# Patient Record
Sex: Female | Born: 1995 | Race: White | Hispanic: No | Marital: Married | Smoking: Never smoker
Health system: Southern US, Community
[De-identification: ages and names within clinical notes are randomized; demographics above are authoritative.]

## PROBLEM LIST (undated history)

## (undated) DIAGNOSIS — Z789 Other specified health status: Secondary | ICD-10-CM

## (undated) DIAGNOSIS — S92909A Unspecified fracture of unspecified foot, initial encounter for closed fracture: Secondary | ICD-10-CM

## (undated) HISTORY — DX: Other specified health status: Z78.9

## (undated) HISTORY — PX: NO PAST SURGERIES: SHX2092

---
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
## 2018-07-27 ENCOUNTER — Other Ambulatory Visit: Payer: Self-pay

## 2018-07-27 ENCOUNTER — Emergency Department (HOSPITAL_COMMUNITY)
Admission: EM | Admit: 2018-07-27 | Discharge: 2018-07-27 | Disposition: A | Discharge status: Home / Self Care | Payer: Managed Care, Other (non HMO) | Attending: Emergency Medicine | Admitting: Emergency Medicine

## 2018-07-27 ENCOUNTER — Encounter (HOSPITAL_COMMUNITY): Payer: Self-pay | Admitting: Emergency Medicine

## 2018-07-27 ENCOUNTER — Emergency Department (HOSPITAL_COMMUNITY): Payer: Managed Care, Other (non HMO)

## 2018-07-27 DIAGNOSIS — W1842XA Slipping, tripping and stumbling without falling due to stepping into hole or opening, initial encounter: Secondary | ICD-10-CM | POA: Diagnosis not present

## 2018-07-27 DIAGNOSIS — Y990 Civilian activity done for income or pay: Secondary | ICD-10-CM | POA: Diagnosis not present

## 2018-07-27 DIAGNOSIS — Y929 Unspecified place or not applicable: Secondary | ICD-10-CM | POA: Diagnosis not present

## 2018-07-27 DIAGNOSIS — S93402A Sprain of unspecified ligament of left ankle, initial encounter: Secondary | ICD-10-CM | POA: Diagnosis not present

## 2018-07-27 DIAGNOSIS — Y939 Activity, unspecified: Secondary | ICD-10-CM | POA: Diagnosis not present

## 2018-07-27 DIAGNOSIS — S99912A Unspecified injury of left ankle, initial encounter: Secondary | ICD-10-CM | POA: Diagnosis present

## 2018-07-27 HISTORY — DX: Unspecified fracture of unspecified foot, initial encounter for closed fracture: S92.909A

## 2018-07-27 MED ORDER — IBUPROFEN 800 MG PO TABS
800.0000 mg | ORAL_TABLET | Freq: Once | ORAL | Status: AC
  Administered 2018-07-27: 800 mg via ORAL
  Filled 2018-07-27: qty 1

## 2018-07-27 MED ORDER — NAPROXEN 500 MG PO TABS: 500.0000 mg | ORAL_TABLET | Freq: Two times a day (BID) | ORAL | 0 refills | Status: DC

## 2018-07-27 NOTE — ED Triage Notes (Addendum)
Pt reports left ankle pain when stepped in a hole. Pt reports left ankle pain and swelling immediately after injury. Pt reports tingling sensation at this time. Pt reports able to bear weight but reports increased pain at this time.

## 2018-07-27 NOTE — Discharge Instructions (Signed)
X-rays are normal, there is no broken bones, ibuprofen every 8 hours as needed, elevate ice and rest, seek medical exam in 1 week if still having pain or swelling

## 2018-07-27 NOTE — ED Provider Notes (Signed)
[facility] Provider Note   CSN: [id number] Arrival date & time: 07/27/18  [id number]     History   Chief Complaint Chief Complaint  Patient presents with  . Ankle Pain    HPI Melinda Wright is a 22 y.o. female.  The history is provided by the patient.  Ankle Pain   The incident occurred 12 to 24 hours ago. The incident occurred at work. The injury mechanism was a fall. The pain is present in the left ankle. The quality of the pain is described as throbbing. The pain is moderate. The pain has been constant since onset. Pertinent negatives include no numbness, no loss of motion, no muscle weakness, no loss of sensation and no tingling. She reports no foreign bodies present. The symptoms are aggravated by bearing weight. She has tried nothing for the symptoms.    22 y/o female - was working yesterday when she stepped in a hole and rolled her ankle - she had acute onset of pain and mild swelling - but has been ambulatory on the ankle since - the pain is persistent and when she awoke this AM it was much more intense.  She has mild swelling, no other injuries / knee / hip / or back / neck pain.  No meds prior to arrival.    Past Medical History:  Diagnosis Date  . Foot fracture 2012   left metatarsal fracture    There are no active problems to display for this patient.   History reviewed. No pertinent surgical history.   OB History   None      Home Medications    Prior to Admission medications   Medication Sig Start Date End Date Taking? Authorizing Provider  naproxen (NAPROSYN) 500 MG tablet Take 1 tablet (500 mg total) by mouth 2 (two) times daily with a meal. 07/27/18   Eber HongMiller, Raghad Lorenz, MD    Family History History reviewed. No pertinent family history.  Social History Social History   Tobacco Use  . Smoking status: Never Smoker  . Smokeless tobacco: Never Used  Substance Use Topics  . Alcohol use: Never    Frequency: Never  . Drug use: Never      Allergies   Patient has no allergy information on record.   Review of Systems Review of Systems  Musculoskeletal: Positive for joint swelling. Negative for back pain.  Neurological: Negative for tingling, weakness and numbness.  Hematological: Does not bruise/bleed easily.     Physical Exam Updated Vital Signs BP 139/62   Pulse (!) 105   Temp 97.9 F (36.6 C) (Oral)   Ht 1.626 m (5\' 4" )   Wt 62.6 kg   LMP 07/06/2018   SpO2 100%   BMI 23.69 kg/m   Physical Exam  Constitutional: She appears well-developed and well-nourished.  HENT:  Head: Normocephalic and atraumatic.  Eyes: Conjunctivae are normal. Right eye exhibits no discharge. Left eye exhibits no discharge.  Pulmonary/Chest: Effort normal. No respiratory distress.  Musculoskeletal:  + ttp over the medial and lateral maleolus - ttp over the base of the 5th MT, dec ROM 2/2 pain - has minimal swelling around ankle.  No knee or prox fib ttp  Neurological: She is alert. Coordination normal.  Skin: Skin is warm and dry. No rash noted. She is not diaphoretic. No erythema.  Psychiatric: She has a normal mood and affect.  Nursing note and vitals reviewed.    ED Treatments / Results  Labs (all labs ordered are  listed, but only abnormal results are displayed) Labs Reviewed - No data to display  EKG None  Radiology Dg Ankle Complete Left  Result Date: 07/27/2018 CLINICAL DATA:  Twisting injury.  Lateral ankle pain and swelling. EXAM: LEFT ANKLE COMPLETE - 3+ VIEW COMPARISON:  Radiographs 07/25/2010. FINDINGS: The mineralization and alignment are normal. There is no evidence of acute fracture or dislocation. Interval closure of the growth plates. The joint spaces are maintained. There is mild lateral soft tissue swelling and a small ankle joint effusion. IMPRESSION: No acute osseous findings. Electronically Signed   By: Carey Bullocks M.D.   On: 07/27/2018 09:20   Dg Foot Complete Left  Result Date:  07/27/2018 CLINICAL DATA:  Ankle pain/injury EXAM: LEFT FOOT - COMPLETE 3+ VIEW COMPARISON:  07/25/2010 FINDINGS: No fracture or dislocation is seen. The joint spaces are preserved. Visualized soft tissues are within normal limits. IMPRESSION: Negative. Electronically Signed   By: Charline Bills M.D.   On: 07/27/2018 09:18    Procedures Procedures (including critical care time)  Medications Ordered in ED Medications  ibuprofen (ADVIL,MOTRIN) tablet 800 mg (800 mg Oral Given 07/27/18 0849)     Initial Impression / Assessment and Plan / ED Course  I have reviewed the triage vital signs and the nursing notes.  Pertinent labs & imaging results that were available during my care of the patient were reviewed by me and considered in my medical decision making (see chart for details).  Clinical Course as of Jul 28 1039  Sun Jul 27, 2018  1039 X-rays of the foot and the ankle are negative for acute fractures.  Rice therapy, patient informed, stable for discharge   [BM]    Clinical Course User Index [BM] Eber Hong, MD   R/o fractures with xrays - pt is well appaering otherwise. xrays ordered, ibuprofen and ice ordered RICE therapy - anticipate d/c with f/u.  Final Clinical Impressions(s) / ED Diagnoses   Final diagnoses:  Sprain of left ankle, unspecified ligament, initial encounter    ED Discharge Orders         Ordered    naproxen (NAPROSYN) 500 MG tablet  2 times daily with meals     07/27/18 1040           Eber Hong, MD 07/27/18 1040

## 2019-01-25 IMAGING — DX DG FOOT COMPLETE 3+V*L*
3 series · 3 of 3 positions shown · non-contrast
Comparison: 07/25/2010

CLINICAL DATA: Ankle pain/injury

EXAM:
LEFT FOOT - COMPLETE 3+ VIEW

[foot ap]
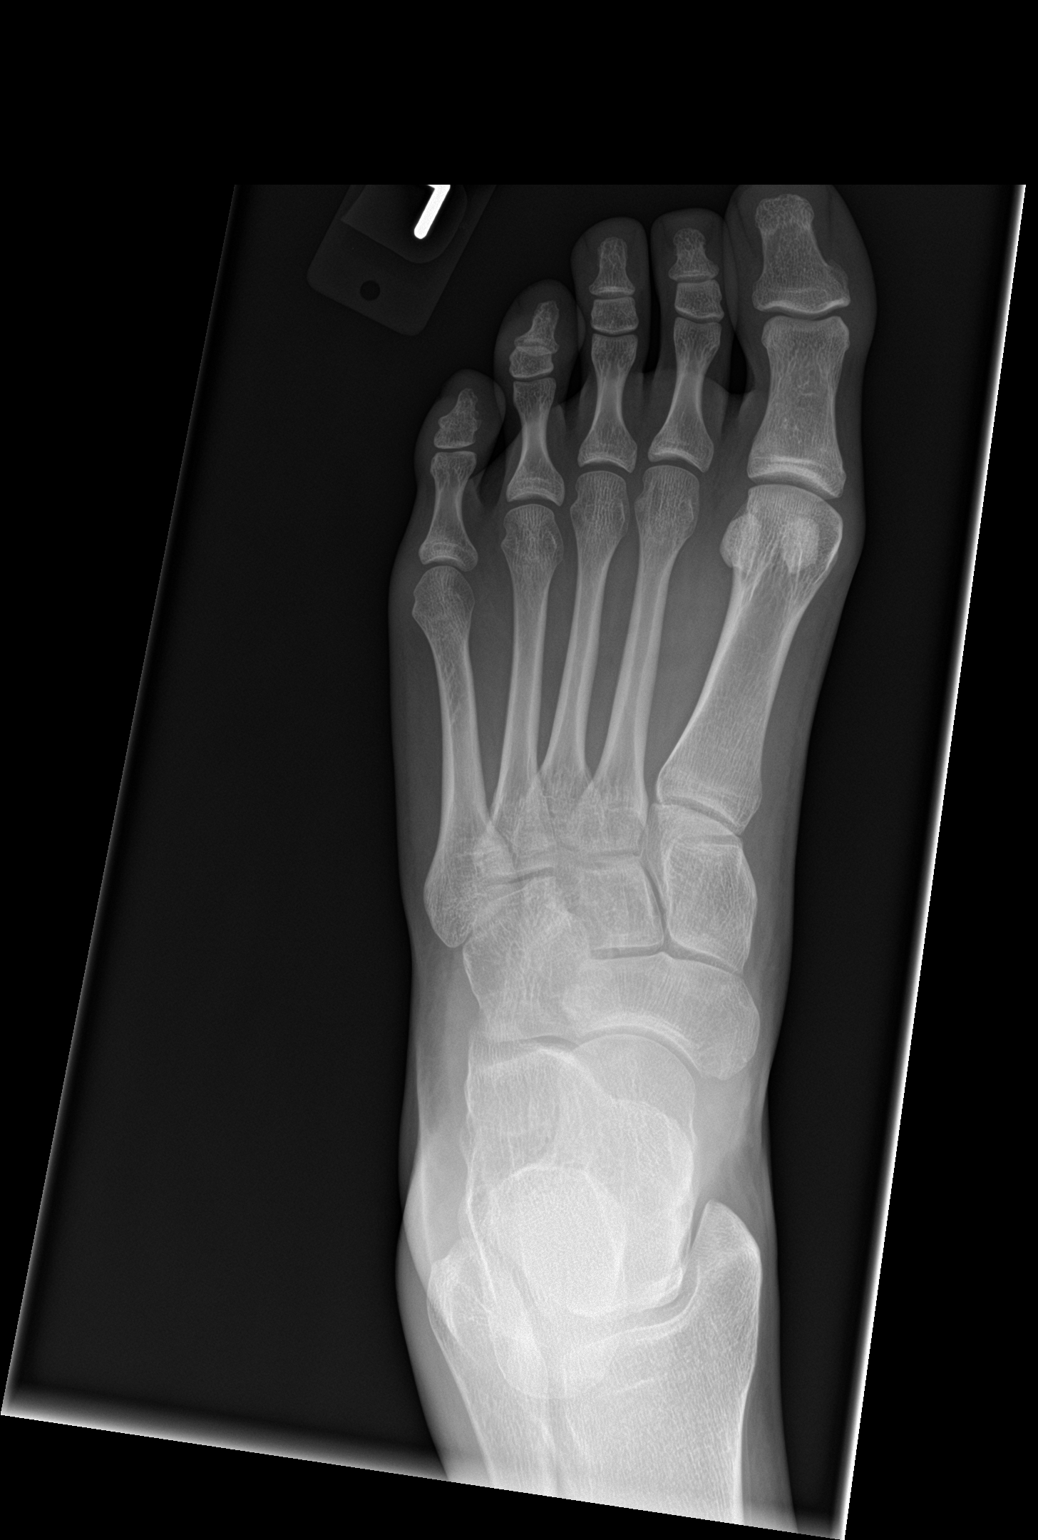

[foot obl]
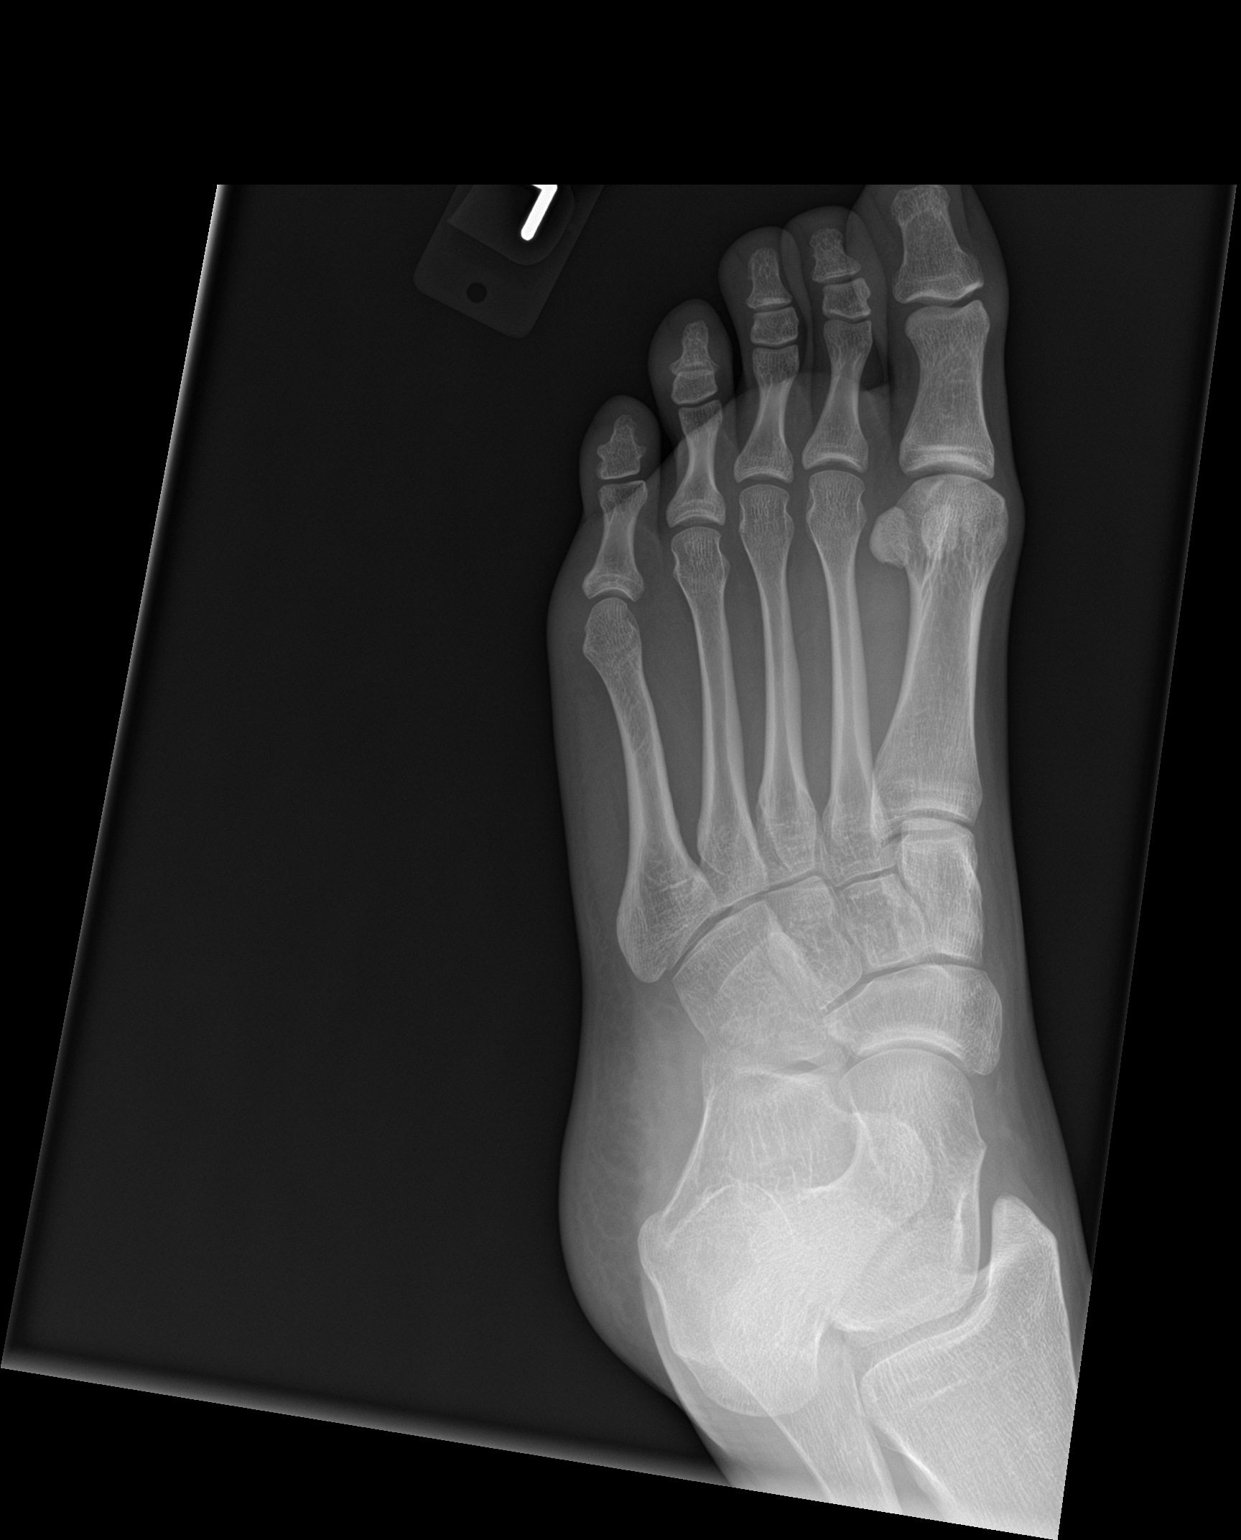

[foot lat]
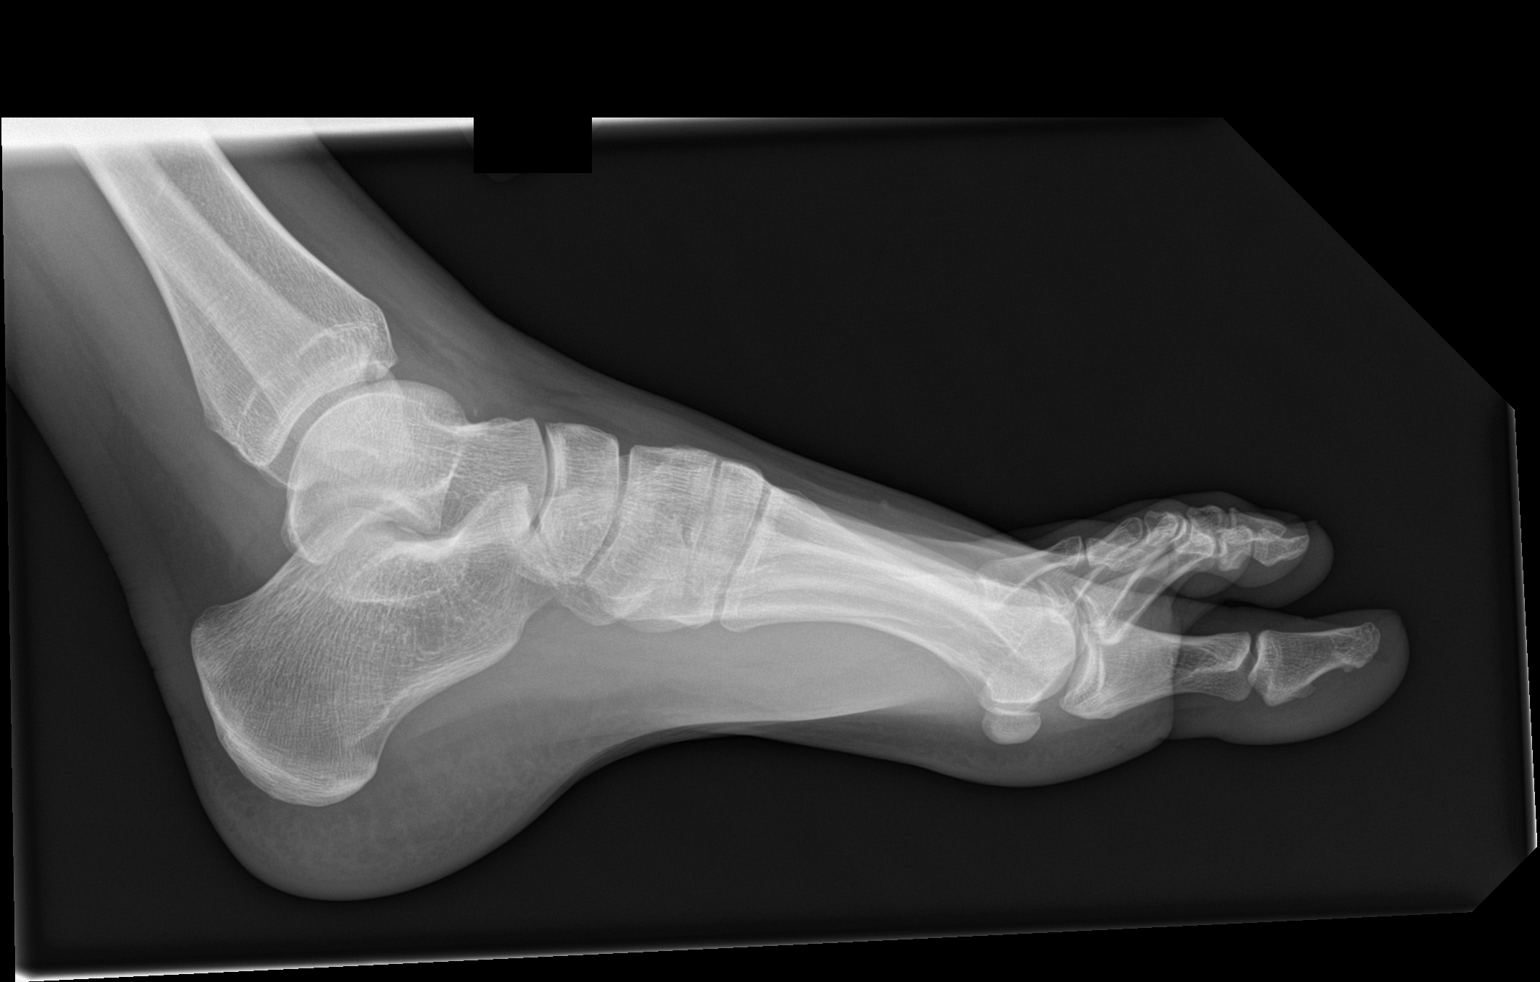

[3 of 3 positions shown; findings below may reference images not displayed]

FINDINGS: No fracture or dislocation is seen.

The joint spaces are preserved.

Visualized soft tissues are within normal limits.
IMPRESSION: Negative.

## 2019-07-20 ENCOUNTER — Emergency Department (HOSPITAL_COMMUNITY)
Admission: EM | Admit: 2019-07-20 | Discharge: 2019-07-20 | Disposition: A | Discharge status: Home / Self Care | Payer: PRIVATE HEALTH INSURANCE

## 2019-07-20 NOTE — ED Notes (Signed)
Called pt name x3 for triage, no response from pt.  

## 2020-11-05 NOTE — L&D Delivery Note (Addendum)
OB/GYN Faculty Practice Delivery Note  Melinda Wright is a 25 y.o. G1P1001 s/p SVD at [redacted]w[redacted]d. She was admitted for IOL for gHTN.   ROM: 4h 57m with clear fluid GBS Status: Negative  Delivery Date/Time: 09/29/2021 at 2150  Delivery: Called to room and patient was complete and pushing. Head delivered LOA. Nuchal cord present and delivered through. Shoulder and body delivered in usual fashion and cord reduced after delivery. Infant with spontaneous cry, placed on mother's abdomen, dried and stimulated. Cord clamped x 2 after 1-minute delay, and cut by FOB under my direct supervision. Cord blood drawn. Placenta delivered spontaneously with gentle cord traction. Fundus firm with massage and Pitocin. Labia, perineum, vagina, and cervix were inspected, and patient was found to have a first degree perineal laceration, bilateral labial lacerations and a right periurethral laceration. The first degree perineal and bilateral labial lacerations were repaired with 3-0 Vicryl and 4-0 Monocryl, respectively. Clot produced with fundal rub. Lower uterine segment found to be mildly boggy with small clots removed. Cytotec 1000 mcg given per rectum with improvement in tone and bleeding.  Placenta: Intact, 3VC - sent to L&D Complications: None Lacerations: First degree perineal laceration, bilateral labial lacerations and right periurethral laceration EBL: 300 cc Analgesia: Epidural, 1% local lidocaine  Infant: Viable female  APGARs 8 and 9   Alonna Buckler, MD PGY1  GME ATTESTATION:  I saw and evaluated the patient. I was present and gloved for the entire delivery and management of the patient. I agree with the findings and the plan of care as documented in the resident's note. I have made changes to documentation as necessary.  Evalina Field, MD OB Fellow, Faculty [facility], [facility] 09/29/2021 11:07 PM

## 2021-02-16 ENCOUNTER — Ambulatory Visit (INDEPENDENT_AMBULATORY_CARE_PROVIDER_SITE_OTHER): Payer: 59 | Admitting: Adult Health

## 2021-02-16 ENCOUNTER — Other Ambulatory Visit: Payer: Self-pay

## 2021-02-16 ENCOUNTER — Encounter: Payer: Self-pay | Admitting: Adult Health

## 2021-02-16 VITALS — BP 119/76 | HR 61 | Ht 65.0 in | Wt 162.0 lb

## 2021-02-16 DIAGNOSIS — Z3A01 Less than 8 weeks gestation of pregnancy: Secondary | ICD-10-CM | POA: Insufficient documentation

## 2021-02-16 DIAGNOSIS — Z3201 Encounter for pregnancy test, result positive: Secondary | ICD-10-CM | POA: Diagnosis not present

## 2021-02-16 DIAGNOSIS — O3680X0 Pregnancy with inconclusive fetal viability, not applicable or unspecified: Secondary | ICD-10-CM | POA: Insufficient documentation

## 2021-02-16 LAB — POCT URINE PREGNANCY: Preg Test, Ur: POSITIVE — AB

## 2021-02-16 NOTE — Patient Instructions (Signed)
Obstetrics: Normal and Problem Pregnancies (7th ed., pp. 102-121). [place], [place]: Elsevier."> Textbook of Family Medicine (9th ed., pp. 365-410). [place], [place]: Else[employer].">  First Trimester of Pregnancy  The first trimester of pregnancy starts on the first day of your last menstrual period until the end of week 12. This is months 1 through 3 of pregnancy. A week after a sperm fertilizes an egg, the egg will implant into the wall of the uterus and begin to develop into a baby. By the end of 12 weeks, all the baby's organs will be formed and the baby will be 2-3 inches in size. Body changes during your first trimester Your body goes through many changes during pregnancy. The changes vary and generally return to normal after your baby is born. Physical changes  You may gain or lose weight.  Your breasts may begin to grow larger and become tender. The tissue that surrounds your nipples (areola) may become darker.  Dark spots or blotches (chloasma or mask of pregnancy) may develop on your face.  You may have changes in your hair. These can include thickening or thinning of your hair or changes in texture. Health changes  You may feel nauseous, and you may vomit.  You may have heartburn.  You may develop headaches.  You may develop constipation.  Your gums may bleed and may be sensitive to brushing and flossing. Other changes  You may tire easily.  You may urinate more often.  Your menstrual periods will stop.  You may have a loss of appetite.  You may develop cravings for certain kinds of food.  You may have changes in your emotions from day to day.  You may have more vivid and strange dreams. Follow these instructions at home: Medicines  Follow your health care provider's instructions regarding medicine use. Specific medicines may be either safe or unsafe to take during pregnancy. Do not take any medicines unless told to by your health care provider.  Take a  prenatal vitamin that contains at least 600 micrograms (mcg) of folic acid. Eating and drinking  Eat a healthy diet that includes fresh fruits and vegetables, whole grains, good sources of protein such as meat, eggs, or tofu, and low-fat dairy products.  Avoid raw meat and unpasteurized juice, milk, and cheese. These carry germs that can harm you and your baby.  If you feel nauseous or you vomit: ? Eat 4 or 5 small meals a day instead of 3 large meals. ? Try eating a few soda crackers. ? Drink liquids between meals instead of during meals.  You may need to take these actions to prevent or treat constipation: ? Drink enough fluid to keep your urine pale yellow. ? Eat foods that are high in fiber, such as beans, whole grains, and fresh fruits and vegetables. ? Limit foods that are high in fat and processed sugars, such as fried or sweet foods. Activity  Exercise only as directed by your health care provider. Most people can continue their usual exercise routine during pregnancy. Try to exercise for 30 minutes at least 5 days a week.  Stop exercising if you develop pain or cramping in the lower abdomen or lower back.  Avoid exercising if it is very hot or humid or if you are at high altitude.  Avoid heavy lifting.  If you choose to, you may have sex unless your health care provider tells you not to. Relieving pain and discomfort  Wear a good support bra to relieve breast   tenderness.  Rest with your legs elevated if you have leg cramps or low back pain.  If you develop bulging veins (varicose veins) in your legs: ? Wear support hose as told by your health care provider. ? Elevate your feet for 15 minutes, 3-4 times a day. ? Limit salt in your diet. Safety  Wear your seat belt at all times when driving or riding in a car.  Talk with your health care provider if someone is verbally or physically abusive to you.  Talk with your health care provider if you are feeling sad or have  thoughts of hurting yourself. Lifestyle  Do not use hot tubs, steam rooms, or saunas.  Do not douche. Do not use tampons or scented sanitary pads.  Do not use herbal remedies, alcohol, illegal drugs, or medicines that are not approved by your health care provider. Chemicals in these products can harm your baby.  Do not use any products that contain nicotine or tobacco, such as cigarettes, e-cigarettes, and chewing tobacco. If you need help quitting, ask your health care provider.  Avoid cat litter boxes and soil used by cats. These carry germs that can cause birth defects in the baby and possibly loss of the unborn baby (fetus) by miscarriage or stillbirth. General instructions  During routine prenatal visits in the first trimester, your health care provider will do a physical exam, perform necessary tests, and ask you how things are going. Keep all follow-up visits. This is important.  Ask for help if you have counseling or nutritional needs during pregnancy. Your health care provider can offer advice or refer you to specialists for help with various needs.  Schedule a dentist appointment. At home, brush your teeth with a soft toothbrush. Floss gently.  Write down your questions. Take them to your prenatal visits. Where to find more information  American Pregnancy Association: americanpregnancy.org  American College of Obstetricians and Gynecologists: acog.org/en/Womens%20Health/Pregnancy  Office on Women's Health: womenshealth.gov/pregnancy Contact a health care provider if you have:  Dizziness.  A fever.  Mild pelvic cramps, pelvic pressure, or nagging pain in the abdominal area.  Nausea, vomiting, or diarrhea that lasts for 24 hours or longer.  A bad-smelling vaginal discharge.  Pain when you urinate.  Known exposure to a contagious illness, such as chickenpox, measles, Zika virus, HIV, or hepatitis. Get help right away if you have:  Spotting or bleeding from your  vagina.  Severe abdominal cramping or pain.  Shortness of breath or chest pain.  Any kind of trauma, such as from a fall or a car crash.  New or increased pain, swelling, or redness in an arm or leg. Summary  The first trimester of pregnancy starts on the first day of your last menstrual period until the end of week 12 (months 1 through 3).  Eating 4 or 5 small meals a day rather than 3 large meals may help to relieve nausea and vomiting.  Do not use any products that contain nicotine or tobacco, such as cigarettes, e-cigarettes, and chewing tobacco. If you need help quitting, ask your health care provider.  Keep all follow-up visits. This is important. This information is not intended to replace advice given to you by your health care provider. Make sure you discuss any questions you have with your health care provider. Document Revised: 03/30/2020 Document Reviewed: 02/04/2020 Elsevier Patient Education  2021 Elsevier Inc.  

## 2021-02-16 NOTE — Progress Notes (Signed)
  Subjective:     Patient ID: Melinda Wright, female   DOB: May 30, 1996, 25 y.o.   MRN: [id number]  HPI Melinda Wright is a 25 year old white female, married, in for UPT, has missed a period and had 5+HPTs. She works at [employer]. PCP is Dr Margo Aye.  Review of Systems +missed period, with 5+HPTs +nasuea +tired Reviewed past medical,surgical, social and family history. Reviewed medications and allergies.     Objective:   Physical Exam BP 119/76 (BP Location: Left Arm, Patient Position: Sitting, Cuff Size: Normal)   Pulse 61   Ht 5\' 5"  (1.651 m)   Wt 162 lb (73.5 kg)   LMP 12/29/2020   BMI 26.96 kg/m UPT is +, about 23 weeks by LMP with EDD 10/05/21.  Skin warm and dry. Neck: mid line trachea, normal thyroid, good ROM, no lymphadenopathy noted. Lungs: clear to ausculation bilaterally. Cardiovascular: regular rate and rhythm.Abdomen is soft and non tender POC 14/1/22 showed IUP with YS and +FHM, about 6+6 weeks.  AA is 1 Fall risk is low PHQ 9 score is 4 GAD 7 score is 0  Upstream - 02/16/21 1110      Pregnancy Intention Screening   Does the patient want to become pregnant in the next year? N/A    Does the patient's partner want to become pregnant in the next year? N/A    Would the patient like to discuss contraceptive options today? N/A      Contraception Wrap Up   Current Method Pregnant/Seeking Pregnancy    End Method Pregnant/Seeking Pregnancy    Contraception Counseling Provided No             Assessment:     1. Pregnancy examination or test, positive result Continue PNV  2. Less than [redacted] weeks gestation of pregnancy Eat often, try hard candy and gum  3. Encounter to determine fetal viability of pregnancy, single or unspecified fetus Return in about 3-4 weeks for dating 02/18/21    Plan:     Review handout on First trimester and by [facility]

## 2021-03-10 DIAGNOSIS — S134XXA Sprain of ligaments of cervical spine, initial encounter: Secondary | ICD-10-CM | POA: Diagnosis not present

## 2021-03-10 DIAGNOSIS — S233XXA Sprain of ligaments of thoracic spine, initial encounter: Secondary | ICD-10-CM | POA: Diagnosis not present

## 2021-03-10 DIAGNOSIS — S338XXA Sprain of other parts of lumbar spine and pelvis, initial encounter: Secondary | ICD-10-CM | POA: Diagnosis not present

## 2021-03-13 ENCOUNTER — Ambulatory Visit (INDEPENDENT_AMBULATORY_CARE_PROVIDER_SITE_OTHER): Payer: BC Managed Care – PPO

## 2021-03-13 ENCOUNTER — Other Ambulatory Visit: Payer: Self-pay

## 2021-03-13 DIAGNOSIS — O3680X0 Pregnancy with inconclusive fetal viability, not applicable or unspecified: Secondary | ICD-10-CM | POA: Diagnosis not present

## 2021-03-13 DIAGNOSIS — Z3A10 10 weeks gestation of pregnancy: Secondary | ICD-10-CM | POA: Diagnosis not present

## 2021-03-13 NOTE — Progress Notes (Signed)
[place] 10+4 wks,single IUP with ys,positive fht 178 bpm,normal ovaries,crl 36.07 mm

## 2021-03-17 DIAGNOSIS — S134XXA Sprain of ligaments of cervical spine, initial encounter: Secondary | ICD-10-CM | POA: Diagnosis not present

## 2021-03-17 DIAGNOSIS — S338XXA Sprain of other parts of lumbar spine and pelvis, initial encounter: Secondary | ICD-10-CM | POA: Diagnosis not present

## 2021-03-17 DIAGNOSIS — S233XXA Sprain of ligaments of thoracic spine, initial encounter: Secondary | ICD-10-CM | POA: Diagnosis not present

## 2021-03-30 ENCOUNTER — Other Ambulatory Visit: Payer: Self-pay | Admitting: Obstetrics & Gynecology

## 2021-03-30 DIAGNOSIS — Z3682 Encounter for antenatal screening for nuchal translucency: Secondary | ICD-10-CM

## 2021-03-31 ENCOUNTER — Ambulatory Visit (INDEPENDENT_AMBULATORY_CARE_PROVIDER_SITE_OTHER): Payer: BC Managed Care – PPO

## 2021-03-31 ENCOUNTER — Other Ambulatory Visit: Payer: Self-pay

## 2021-03-31 ENCOUNTER — Other Ambulatory Visit: Payer: 59

## 2021-03-31 DIAGNOSIS — Z3682 Encounter for antenatal screening for nuchal translucency: Secondary | ICD-10-CM

## 2021-03-31 DIAGNOSIS — Z1379 Encounter for other screening for genetic and chromosomal anomalies: Secondary | ICD-10-CM | POA: Diagnosis not present

## 2021-03-31 DIAGNOSIS — S338XXA Sprain of other parts of lumbar spine and pelvis, initial encounter: Secondary | ICD-10-CM | POA: Diagnosis not present

## 2021-03-31 DIAGNOSIS — Z3401 Encounter for supervision of normal first pregnancy, first trimester: Secondary | ICD-10-CM

## 2021-03-31 DIAGNOSIS — S233XXA Sprain of ligaments of thoracic spine, initial encounter: Secondary | ICD-10-CM | POA: Diagnosis not present

## 2021-03-31 DIAGNOSIS — Z3A13 13 weeks gestation of pregnancy: Secondary | ICD-10-CM | POA: Diagnosis not present

## 2021-03-31 DIAGNOSIS — Z3400 Encounter for supervision of normal first pregnancy, unspecified trimester: Secondary | ICD-10-CM | POA: Insufficient documentation

## 2021-03-31 DIAGNOSIS — S134XXA Sprain of ligaments of cervical spine, initial encounter: Secondary | ICD-10-CM | POA: Diagnosis not present

## 2021-03-31 DIAGNOSIS — Z3402 Encounter for supervision of normal first pregnancy, second trimester: Secondary | ICD-10-CM

## 2021-03-31 NOTE — Progress Notes (Signed)
[place] 13+1 wks,measurements c/w dates,crl 73.97 mm,posterior placenta gr 0,normal ovaries,NT 1.4 mm,NB present,fhr 158 BPM

## 2021-03-31 NOTE — Progress Notes (Signed)
   NURSE VISIT- [facility]  SUBJECTIVE:  Melinda Wright is a 25 y.o. G1P0 female here for Panorama NIPT . She is [redacted]w[redacted]d pregnant.   OBJECTIVE:  Appears well, in no apparent distress  Blood work drawn from right [facility] without difficulty. 1 attempt(s).   ASSESSMENT: Pregnancy [redacted]w[redacted]d Panorama NIPT  PLAN: [facility] portal information given and instructed patient how to access results Follow-up as scheduled  Jobe Marker  03/31/2021 12:17 PM

## 2021-04-01 LAB — CBC/D/PLT+RPR+RH+ABO+RUBIGG...
Antibody Screen: NEGATIVE
Basophils Absolute: 0 10*3/uL (ref 0.0–0.2)
Basos: 0 %
EOS (ABSOLUTE): 0.1 10*3/uL (ref 0.0–0.4)
Eos: 1 %
HCV Ab: <0.1 s/co ratio (ref 0.0–0.9)
HIV Screen 4th Generation wRfx: NONREACTIVE
Hematocrit: 39.7 % (ref 34.0–46.6)
Hemoglobin: 13.4 g/dL (ref 11.1–15.9)
Hepatitis B Surface Ag: NEGATIVE
Immature Grans (Abs): 0.1 10*3/uL (ref 0.0–0.1)
Immature Granulocytes: 1 %
Lymphocytes Absolute: 2.2 10*3/uL (ref 0.7–3.1)
Lymphs: 23 %
MCH: 29.6 pg (ref 26.6–33.0)
MCHC: 33.8 g/dL (ref 31.5–35.7)
MCV: 88 fL (ref 79–97)
Monocytes Absolute: 0.8 10*3/uL (ref 0.1–0.9)
Monocytes: 9 %
Neutrophils Absolute: 6.3 10*3/uL (ref 1.4–7.0)
Neutrophils: 66 %
Platelets: 274 10*3/uL (ref 150–450)
RBC: 4.52 x10E6/uL (ref 3.77–5.28)
RDW: 12.6 % (ref 11.7–15.4)
RPR Ser Ql: NONREACTIVE
Rh Factor: POSITIVE
Rubella Antibodies, IGG: 2.98 index (ref >0.99)
WBC: 9.5 10*3/uL (ref 3.4–10.8)

## 2021-04-01 LAB — HCV INTERPRETATION

## 2021-04-03 LAB — INTEGRATED 1
Crown Rump Length: 74 mm
Gest. Age on Collection Date: 13.3 weeks
Maternal Age at EDD: 23.9 yr
Nuchal Translucency (NT): 1.4 mm
Number of Fetuses: 1
PAPP-A Value: 1327.5 ng/mL
Weight: 159 [lb_av]

## 2021-04-04 ENCOUNTER — Ambulatory Visit: Payer: 59 | Admitting: *Deleted

## 2021-04-04 ENCOUNTER — Other Ambulatory Visit: Payer: Self-pay

## 2021-04-04 ENCOUNTER — Encounter: Payer: Self-pay | Admitting: Women's Health

## 2021-04-04 ENCOUNTER — Ambulatory Visit: Payer: BC Managed Care – PPO | Admitting: *Deleted

## 2021-04-04 ENCOUNTER — Other Ambulatory Visit (HOSPITAL_COMMUNITY)
Admission: RE | Admit: 2021-04-04 | Discharge: 2021-04-04 | Disposition: A | Discharge status: Home / Self Care | Payer: BC Managed Care – PPO | Source: Ambulatory Visit | Attending: Obstetrics & Gynecology | Admitting: Obstetrics & Gynecology

## 2021-04-04 ENCOUNTER — Ambulatory Visit (INDEPENDENT_AMBULATORY_CARE_PROVIDER_SITE_OTHER): Payer: 59 | Admitting: Women's Health

## 2021-04-04 VITALS — BP 106/62 | HR 70 | Wt 163.0 lb

## 2021-04-04 DIAGNOSIS — Z3401 Encounter for supervision of normal first pregnancy, first trimester: Secondary | ICD-10-CM | POA: Insufficient documentation

## 2021-04-04 DIAGNOSIS — Z3A13 13 weeks gestation of pregnancy: Secondary | ICD-10-CM

## 2021-04-04 DIAGNOSIS — Z363 Encounter for antenatal screening for malformations: Secondary | ICD-10-CM

## 2021-04-04 LAB — POCT URINALYSIS DIPSTICK OB
Blood, UA: NEGATIVE
Glucose, UA: NEGATIVE
Ketones, UA: NEGATIVE
Leukocytes, UA: NEGATIVE
Nitrite, UA: NEGATIVE
POC,PROTEIN,UA: NEGATIVE

## 2021-04-04 NOTE — Progress Notes (Signed)
INITIAL OBSTETRICAL VISIT Patient name: Melinda Wright MRN [id number]  Date of birth: [birth year] Chief Complaint:   Initial Prenatal Visit  History of Present Illness:   Melinda Wright is a 25 y.o. G1P0 Caucasian female at [redacted]w[redacted]d by LMP c/w u/s at 10 weeks with an Estimated Date of Delivery: 10/05/21 being seen today for her initial obstetrical visit.   Patient's last menstrual period was 12/29/2020. Her obstetrical history is significant for primigravida.   Today she reports feeling much better, was having nausea earlier.  Last pap never. Results were: N/A  Depression screen [facility] 2/9 04/04/2021 02/16/2021  Decreased Interest 0 0  Down, Depressed, Hopeless 0 0  PHQ - 2 Score 0 0  Altered sleeping 1 2  Tired, decreased energy 0 2  Change in appetite 0 0  Feeling bad or failure about yourself  0 0  Trouble concentrating 0 0  Moving slowly or fidgety/restless 0 0  Suicidal thoughts 0 0  PHQ-9 Score 1 4     GAD 7 : Generalized Anxiety Score 04/04/2021 02/16/2021  Nervous, Anxious, on Edge 0 0  Control/stop worrying 0 0  Worry too much - different things 0 0  Trouble relaxing 0 0  Restless 0 0  Easily annoyed or irritable 0 0  Afraid - awful might happen 0 0  Total GAD 7 Score 0 0     Review of Systems:   Pertinent items are noted in HPI Denies cramping/contractions, leakage of fluid, vaginal bleeding, abnormal vaginal discharge w/ itching/odor/irritation, headaches, visual changes, shortness of breath, chest pain, abdominal pain, severe nausea/vomiting, or problems with urination or bowel movements unless otherwise stated above.  Pertinent History Reviewed:  Reviewed past medical,surgical, social, obstetrical and family history.  Reviewed problem list, medications and allergies. OB History  Gravida Para Term Preterm AB Living  1            SAB IAB Ectopic Multiple Live Births               # Outcome Date GA Lbr Len/2nd Weight Sex Delivery Anes PTL Lv  1 Current             Physical Assessment:   Vitals:   04/04/21 1533  BP: 106/62  Pulse: 70  Weight: 163 lb (73.9 kg)  Body mass index is 27.12 kg/m.       Physical Examination:  General appearance - well appearing, and in no distress  Mental status - alert, oriented to person, place, and time  Psych:  She has a normal mood and affect  Skin - warm and dry, normal color, no suspicious lesions noted  Chest - effort normal, all lung fields clear to auscultation bilaterally  Heart - normal rate and regular rhythm  Abdomen - soft, nontender  Extremities:  No swelling or varicosities noted  Pelvic - VULVA: normal appearing vulva with no masses, tenderness or lesions  VAGINA: normal appearing vagina with normal color and discharge, no lesions  CERVIX: normal appearing cervix without discharge or lesions, no CMT  Thin prep pap is done w/ HR HPV cotesting  Chaperone: Faith Rogue    TODAY'S FHR: 153 via doppler  Results for orders placed or performed in visit on 04/04/21 (from the past 24 hour(s))  POC Urinalysis Dipstick OB   Collection Time: 04/04/21  3:34 PM  Result Value Ref Range   Color, UA     Clarity, UA     Glucose, UA Negative Negative  Bilirubin, UA     Ketones, UA neg    Spec Grav, UA     Blood, UA neg    pH, UA     POC,PROTEIN,UA Negative Negative, Trace, Small (1+), Moderate (2+), Large (3+), 4+   Urobilinogen, UA     Nitrite, UA neg    Leukocytes, UA Negative Negative   Appearance     Odor      Assessment & Plan:  1) Low-Risk Pregnancy G1P0 at [redacted]w[redacted]d with an Estimated Date of Delivery: 10/05/21   2) Initial OB visit  3) Cervical cancer screening  Meds: No orders of the defined types were placed in this encounter.   Initial labs obtained Continue prenatal vitamins Reviewed n/v relief measures and warning s/s to report Reviewed recommended weight gain based on pre-gravid BMI Encouraged well-balanced diet Genetic & carrier screening discussed: requests Panorama and  NT/IT, declines Horizon  Ultrasound discussed; fetal survey: requested CCNC completed> form faxed if has or is planning to apply for medicaid The nature of [facility] [employer] with multiple MDs and other Advanced Practice Providers was explained to patient; also emphasized that fellows, residents, and students are part of our team. Does have home bp cuff. Office bp cuff given: no. Rx sent: no. Check bp weekly, let us know if consistently >140/90.   Follow-up: Return in 1 month (on 05/04/2021) for LROB, 2nd IT, QD:IYMEBRA, CNM, in person.   Orders Placed This Encounter  Procedures  . Urine Culture  . US OB Comp + 14 Wk  . Genetic Screening  . POC Urinalysis Dipstick OB    Cheral Marker CNM, [facility] 04/04/2021 3:59 PM

## 2021-04-04 NOTE — Patient Instructions (Signed)
Rudi, thank you for choosing our office today! We appreciate the opportunity to meet your healthcare needs. You may receive a short survey by mail, e-mail, or through MyChart. If you are happy with your care we would appreciate if you could take just a few minutes to complete the survey questions. We read all of your comments and take your feedback very seriously. Thank you again for choosing our office.  [facility]  [facility] ([address] [place], [place] [address]) Entrance C, located off of [address] Free 24/7 valet parking   Nausea & Vomiting Have saltine crackers or pretzels by your bed and eat a few bites before you raise your head out of bed in the morning Eat small frequent meals throughout the day instead of large meals Drink plenty of fluids throughout the day to stay hydrated, just don't drink a lot of fluids with your meals.  This can make your stomach fill up faster making you feel sick Do not brush your teeth right after you eat Products with real ginger are good for nausea, like ginger ale and ginger hard candy Make sure it says made with real ginger! Sucking on sour candy like lemon heads is also good for nausea If your prenatal vitamins make you nauseated, take them at night so you will sleep through the nausea Sea Bands If you feel like you need medicine for the nausea & vomiting please let us know If you are unable to keep any fluids or food down please let us know   Constipation Drink plenty of fluid, preferably water, throughout the day Eat foods high in fiber such as fruits, vegetables, and grains Exercise, such as walking, is a good way to keep your bowels regular Drink warm fluids, especially warm prune juice, or decaf coffee Eat a 1/2 cup of real oatmeal (not instant), 1/2 cup applesauce, and 1/2-1 cup warm prune juice every day If needed, you may take Colace (docusate sodium) stool softener  once or twice a day to help keep the stool soft.  If you still are having problems with constipation, you may take Miralax once daily as needed to help keep your bowels regular.   Home Blood Pressure Monitoring for Patients   Your provider has recommended that you check your blood pressure (BP) at least once a week at home. If you do not have a blood pressure cuff at home, one will be provided for you. Contact your provider if you have not received your monitor within 1 week.   Helpful Tips for Accurate Home Blood Pressure Checks  Don't smoke, exercise, or drink caffeine 30 minutes before checking your BP Use the restroom before checking your BP (a full bladder can raise your pressure) Relax in a comfortable upright chair Feet on the ground Left arm resting comfortably on a flat surface at the level of your heart Legs uncrossed Back supported Sit quietly and don't talk Place the cuff on your bare arm Adjust snuggly, so that only two fingertips can fit between your skin and the top of the cuff Check 2 readings separated by at least one minute Keep a log of your BP readings For a visual, please reference this diagram: http://ccnc.care/bpdiagram  Provider Name: [facility]     Phone: [phone]  Zone 1: ALL CLEAR  Continue to monitor your symptoms:  BP reading is less than 140 (top number) or less than 90 (bottom   number)  No right upper stomach pain No headaches or seeing spots No feeling nauseated or throwing up No swelling in face and hands  Zone 2: CAUTION Call your doctor's office for any of the following:  BP reading is greater than 140 (top number) or greater than 90 (bottom number)  Stomach pain under your ribs in the middle or right side Headaches or seeing spots Feeling nauseated or throwing up Swelling in face and hands  Zone 3: EMERGENCY  Seek immediate medical care if you have any of the following:  BP reading is greater than160 (top number) or greater than  110 (bottom number) Severe headaches not improving with Tylenol Serious difficulty catching your breath Any worsening symptoms from Zone 2    First Trimester of Pregnancy The first trimester of pregnancy is from week 1 until the end of week 12 (months 1 through 3). A week after a sperm fertilizes an egg, the egg will implant on the wall of the uterus. This embryo will begin to develop into a baby. Genes from you and your partner are forming the baby. The female genes determine whether the baby is a boy or a girl. At 6-8 weeks, the eyes and face are formed, and the heartbeat can be seen on ultrasound. At the end of 12 weeks, all the baby's organs are formed.  Now that you are pregnant, you will want to do everything you can to have a healthy baby. Two of the most important things are to get good prenatal care and to follow your health care provider's instructions. Prenatal care is all the medical care you receive before the baby's birth. This care will help prevent, find, and treat any problems during the pregnancy and childbirth. BODY CHANGES Your body goes through many changes during pregnancy. The changes vary from woman to woman.  You may gain or lose a couple of pounds at first. You may feel sick to your stomach (nauseous) and throw up (vomit). If the vomiting is uncontrollable, call your health care provider. You may tire easily. You may develop headaches that can be relieved by medicines approved by your health care provider. You may urinate more often. Painful urination may mean you have a bladder infection. You may develop heartburn as a result of your pregnancy. You may develop constipation because certain hormones are causing the muscles that push waste through your intestines to slow down. You may develop hemorrhoids or swollen, bulging veins (varicose veins). Your breasts may begin to grow larger and become tender. Your nipples may stick out more, and the tissue that surrounds them  (areola) may become darker. Your gums may bleed and may be sensitive to brushing and flossing. Dark spots or blotches (chloasma, mask of pregnancy) may develop on your face. This will likely fade after the baby is born. Your menstrual periods will stop. You may have a loss of appetite. You may develop cravings for certain kinds of food. You may have changes in your emotions from day to day, such as being excited to be pregnant or being concerned that something may go wrong with the pregnancy and baby. You may have more vivid and strange dreams. You may have changes in your hair. These can include thickening of your hair, rapid growth, and changes in texture. Some women also have hair loss during or after pregnancy, or hair that feels dry or thin. Your hair will most likely return to normal after your baby is born. WHAT TO EXPECT AT YOUR PRENATAL   VISITS During a routine prenatal visit: You will be weighed to make sure you and the baby are growing normally. Your blood pressure will be taken. Your abdomen will be measured to track your baby's growth. The fetal heartbeat will be listened to starting around week 10 or 12 of your pregnancy. Test results from any previous visits will be discussed. Your health care provider may ask you: How you are feeling. If you are feeling the baby move. If you have had any abnormal symptoms, such as leaking fluid, bleeding, severe headaches, or abdominal cramping. If you have any questions. Other tests that may be performed during your first trimester include: Blood tests to find your blood type and to check for the presence of any previous infections. They will also be used to check for low iron levels (anemia) and Rh antibodies. Later in the pregnancy, blood tests for diabetes will be done along with other tests if problems develop. Urine tests to check for infections, diabetes, or protein in the urine. An ultrasound to confirm the proper growth and development  of the baby. An amniocentesis to check for possible genetic problems. Fetal screens for spina bifida and Down syndrome. You may need other tests to make sure you and the baby are doing well. HOME CARE INSTRUCTIONS  Medicines Follow your health care provider's instructions regarding medicine use. Specific medicines may be either safe or unsafe to take during pregnancy. Take your prenatal vitamins as directed. If you develop constipation, try taking a stool softener if your health care provider approves. Diet Eat regular, well-balanced meals. Choose a variety of foods, such as meat or vegetable-based protein, fish, milk and low-fat dairy products, vegetables, fruits, and whole grain breads and cereals. Your health care provider will help you determine the amount of weight gain that is right for you. Avoid raw meat and uncooked cheese. These carry germs that can cause birth defects in the baby. Eating four or five small meals rather than three large meals a day may help relieve nausea and vomiting. If you start to feel nauseous, eating a few soda crackers can be helpful. Drinking liquids between meals instead of during meals also seems to help nausea and vomiting. If you develop constipation, eat more high-fiber foods, such as fresh vegetables or fruit and whole grains. Drink enough fluids to keep your urine clear or pale yellow. Activity and Exercise Exercise only as directed by your health care provider. Exercising will help you: Control your weight. Stay in shape. Be prepared for labor and delivery. Experiencing pain or cramping in the lower abdomen or low back is a good sign that you should stop exercising. Check with your health care provider before continuing normal exercises. Try to avoid standing for long periods of time. Move your legs often if you must stand in one place for a long time. Avoid heavy lifting. Wear low-heeled shoes, and practice good posture. You may continue to have sex  unless your health care provider directs you otherwise. Relief of Pain or Discomfort Wear a good support bra for breast tenderness.   Take warm sitz baths to soothe any pain or discomfort caused by hemorrhoids. Use hemorrhoid cream if your health care provider approves.   Rest with your legs elevated if you have leg cramps or low back pain. If you develop varicose veins in your legs, wear support hose. Elevate your feet for 15 minutes, 3-4 times a day. Limit salt in your diet. Prenatal Care Schedule your prenatal visits by the   twelfth week of pregnancy. They are usually scheduled monthly at first, then more often in the last 2 months before delivery. Write down your questions. Take them to your prenatal visits. Keep all your prenatal visits as directed by your health care provider. Safety Wear your seat belt at all times when driving. Make a list of emergency phone numbers, including numbers for family, friends, the hospital, and police and fire departments. General Tips Ask your health care provider for a referral to a local prenatal education class. Begin classes no later than at the beginning of month 6 of your pregnancy. Ask for help if you have counseling or nutritional needs during pregnancy. Your health care provider can offer advice or refer you to specialists for help with various needs. Do not use hot tubs, steam rooms, or saunas. Do not douche or use tampons or scented sanitary pads. Do not cross your legs for long periods of time. Avoid cat litter boxes and soil used by cats. These carry germs that can cause birth defects in the baby and possibly loss of the fetus by miscarriage or stillbirth. Avoid all smoking, herbs, alcohol, and medicines not prescribed by your health care provider. Chemicals in these affect the formation and growth of the baby. Schedule a dentist appointment. At home, brush your teeth with a soft toothbrush and be gentle when you floss. SEEK MEDICAL CARE IF:   You have dizziness. You have mild pelvic cramps, pelvic pressure, or nagging pain in the abdominal area. You have persistent nausea, vomiting, or diarrhea. You have a bad smelling vaginal discharge. You have pain with urination. You notice increased swelling in your face, hands, legs, or ankles. SEEK IMMEDIATE MEDICAL CARE IF:  You have a fever. You are leaking fluid from your vagina. You have spotting or bleeding from your vagina. You have severe abdominal cramping or pain. You have rapid weight gain or loss. You vomit blood or material that looks like coffee grounds. You are exposed to German measles and have never had them. You are exposed to fifth disease or chickenpox. You develop a severe headache. You have shortness of breath. You have any kind of trauma, such as from a fall or a car accident. Document Released: 10/16/2001 Document Revised: 03/08/2014 Document Reviewed: 09/01/2013 [employer] Patient Information 2015 [employer]. This information is not intended to replace advice given to you by your health care provider. Make sure you discuss any questions you have with your health care provider.  

## 2021-04-06 LAB — CYTOLOGY - PAP
Chlamydia: NEGATIVE
Comment: NEGATIVE
Comment: NORMAL
Diagnosis: NEGATIVE
Neisseria Gonorrhea: NEGATIVE

## 2021-04-07 LAB — URINE CULTURE

## 2021-04-10 ENCOUNTER — Encounter: Payer: Self-pay | Admitting: Women's Health

## 2021-04-28 DIAGNOSIS — Z7689 Persons encountering health services in other specified circumstances: Secondary | ICD-10-CM | POA: Diagnosis not present

## 2021-04-28 DIAGNOSIS — Z331 Pregnant state, incidental: Secondary | ICD-10-CM | POA: Diagnosis not present

## 2021-04-28 DIAGNOSIS — R000 Tachycardia, unspecified: Secondary | ICD-10-CM | POA: Diagnosis not present

## 2021-05-05 ENCOUNTER — Ambulatory Visit (INDEPENDENT_AMBULATORY_CARE_PROVIDER_SITE_OTHER): Payer: 59 | Admitting: Advanced Practice Midwife

## 2021-05-05 ENCOUNTER — Ambulatory Visit (INDEPENDENT_AMBULATORY_CARE_PROVIDER_SITE_OTHER): Payer: BC Managed Care – PPO

## 2021-05-05 ENCOUNTER — Other Ambulatory Visit: Payer: Self-pay

## 2021-05-05 ENCOUNTER — Encounter: Payer: Self-pay | Admitting: Advanced Practice Midwife

## 2021-05-05 VITALS — BP 120/65 | HR 94

## 2021-05-05 DIAGNOSIS — Z3402 Encounter for supervision of normal first pregnancy, second trimester: Secondary | ICD-10-CM | POA: Diagnosis not present

## 2021-05-05 DIAGNOSIS — Z3A18 18 weeks gestation of pregnancy: Secondary | ICD-10-CM

## 2021-05-05 DIAGNOSIS — Z363 Encounter for antenatal screening for malformations: Secondary | ICD-10-CM

## 2021-05-05 DIAGNOSIS — Z3401 Encounter for supervision of normal first pregnancy, first trimester: Secondary | ICD-10-CM

## 2021-05-05 NOTE — Patient Instructions (Signed)
Keith, thank you for choosing our office today! We appreciate the opportunity to meet your healthcare needs. You may receive a short survey by mail, e-mail, or through MyChart. If you are happy with your care we would appreciate if you could take just a few minutes to complete the survey questions. We read all of your comments and take your feedback very seriously. Thank you again for choosing our office.  [facility] Team at [facility] ([address] [place], [place] [address]) Entrance C, located off of [address] Free 24/7 valet parking  Go to [email or web address] to register for FREE online childbirth classes  Call the office ([phone]) or go to [facility] if: You begin to severe cramping Your water breaks.  Sometimes it is a big gush of fluid, sometimes it is just a trickle that keeps getting your panties wet or running down your legs You have vaginal bleeding.  It is normal to have a small amount of spotting if your cervix was checked.   [place] [facility]ians/Family Doctors [facility] ([facility]): [address], [phone]           [facility]: [address], [phone]                 [facility] ([facility]): [address], [phone] (call to ask if accepting patients) [facility]: [address], [place], [phone]    [place] Pediatricians/Family Doctors [facility] ([facility]): [address], [phone] [facility]: [address], [phone] [facility]: [address], [phone]  [place] [facility]  [facility] ([facility]): [phone] [facility]: [address], [phone]   [place] [facility]: [address], [phone]  [place] [facility]  [facility]: [address], [phone]  Home Blood Pressure Monitoring for Patients   Your provider has recommended that you check your blood pressure (BP) at least once a week at home. If you do not have a blood pressure cuff at home, one will be provided for you. Contact your provider if you have not received your monitor within 1 week.   Helpful Tips for Accurate Home Blood Pressure Checks  Don't smoke, exercise, or drink caffeine 30 minutes before checking your BP Use the restroom before checking your BP (a full bladder can raise your pressure) Relax in a comfortable upright chair Feet on the ground Left arm resting comfortably on a flat surface at the level of your heart Legs uncrossed Back supported Sit quietly and don't talk Place the cuff on your bare arm Adjust snuggly, so that only two fingertips can fit between your skin and the top of the cuff Check 2 readings separated by at least one minute Keep a log of your BP readings For a visual, please reference this diagram: http://[facility].care/bpdiagram  Provider Name: [facility]     Phone: [phone]  Zone 1: ALL CLEAR  Continue to monitor your symptoms:  BP reading is less than 140 (top number) or less than 90 (bottom number)  No right upper stomach pain No headaches or seeing spots No feeling nauseated or throwing up No swelling in face and hands  Zone 2: CAUTION Call your doctor's office for any of the following:  BP reading is greater than 140 (top number) or greater than   90 (bottom number)  Stomach pain under your ribs in the middle or right side Headaches or seeing spots Feeling nauseated or throwing up Swelling in face and hands  Zone 3: EMERGENCY  Seek immediate medical care if you have any of the following:  BP reading is greater than160 (top number) or greater than 110 (bottom number) Severe headaches not improving with Tylenol Serious difficulty catching your breath Any worsening symptoms from  Zone 2     Second Trimester of Pregnancy The second trimester is from week 14 through week 27 (months 4 through 6). The second trimester is often a time when you feel your best. Your body has adjusted to being pregnant, and you begin to feel better physically. Usually, morning sickness has lessened or quit completely, you may have more energy, and you may have an increase in appetite. The second trimester is also a time when the fetus is growing rapidly. At the end of the sixth month, the fetus is about 9 inches long and weighs about 1 pounds. You will likely begin to feel the baby move (quickening) between 16 and 20 weeks of pregnancy. Body changes during your second trimester Your body continues to go through many changes during your second trimester. The changes vary from woman to woman. Your weight will continue to increase. You will notice your lower abdomen bulging out. You may begin to get stretch marks on your hips, abdomen, and breasts. You may develop headaches that can be relieved by medicines. The medicines should be approved by your health care provider. You may urinate more often because the fetus is pressing on your bladder. You may develop or continue to have heartburn as a result of your pregnancy. You may develop constipation because certain hormones are causing the muscles that push waste through your intestines to slow down. You may develop hemorrhoids or swollen, bulging veins (varicose veins). You may have back pain. This is caused by: Weight gain. Pregnancy hormones that are relaxing the joints in your pelvis. A shift in weight and the muscles that support your balance. Your breasts will continue to grow and they will continue to become tender. Your gums may bleed and may be sensitive to brushing and flossing. Dark spots or blotches (chloasma, mask of pregnancy) may develop on your face. This will likely fade after the baby is born. A dark line from your belly button to  the pubic area (linea nigra) may appear. This will likely fade after the baby is born. You may have changes in your hair. These can include thickening of your hair, rapid growth, and changes in texture. Some women also have hair loss during or after pregnancy, or hair that feels dry or thin. Your hair will most likely return to normal after your baby is born.  What to expect at prenatal visits During a routine prenatal visit: You will be weighed to make sure you and the fetus are growing normally. Your blood pressure will be taken. Your abdomen will be measured to track your baby's growth. The fetal heartbeat will be listened to. Any test results from the previous visit will be discussed.  Your health care provider may ask you: How you are feeling. If you are feeling the baby move. If you have had any abnormal symptoms, such as leaking fluid, bleeding, severe headaches, or abdominal cramping. If you are using any tobacco products, including cigarettes, chewing tobacco, and electronic cigarettes. If you have any questions.  Other tests that may be performed during   your second trimester include: Blood tests that check for: Low iron levels (anemia). High blood sugar that affects pregnant women (gestational diabetes) between 24 and 28 weeks. Rh antibodies. This is to check for a protein on red blood cells (Rh factor). Urine tests to check for infections, diabetes, or protein in the urine. An ultrasound to confirm the proper growth and development of the baby. An amniocentesis to check for possible genetic problems. Fetal screens for spina bifida and Down syndrome. HIV (human immunodeficiency virus) testing. Routine prenatal testing includes screening for HIV, unless you choose not to have this test.  Follow these instructions at home: Medicines Follow your health care provider's instructions regarding medicine use. Specific medicines may be either safe or unsafe to take during  pregnancy. Take a prenatal vitamin that contains at least 600 micrograms (mcg) of folic acid. If you develop constipation, try taking a stool softener if your health care provider approves. Eating and drinking Eat a balanced diet that includes fresh fruits and vegetables, whole grains, good sources of protein such as meat, eggs, or tofu, and low-fat dairy. Your health care provider will help you determine the amount of weight gain that is right for you. Avoid raw meat and uncooked cheese. These carry germs that can cause birth defects in the baby. If you have low calcium intake from food, talk to your health care provider about whether you should take a daily calcium supplement. Limit foods that are high in fat and processed sugars, such as fried and sweet foods. To prevent constipation: Drink enough fluid to keep your urine clear or pale yellow. Eat foods that are high in fiber, such as fresh fruits and vegetables, whole grains, and beans. Activity Exercise only as directed by your health care provider. Most women can continue their usual exercise routine during pregnancy. Try to exercise for 30 minutes at least 5 days a week. Stop exercising if you experience uterine contractions. Avoid heavy lifting, wear low heel shoes, and practice good posture. A sexual relationship may be continued unless your health care provider directs you otherwise. Relieving pain and discomfort Wear a good support bra to prevent discomfort from breast tenderness. Take warm sitz baths to soothe any pain or discomfort caused by hemorrhoids. Use hemorrhoid cream if your health care provider approves. Rest with your legs elevated if you have leg cramps or low back pain. If you develop varicose veins, wear support hose. Elevate your feet for 15 minutes, 3-4 times a day. Limit salt in your diet. Prenatal Care Write down your questions. Take them to your prenatal visits. Keep all your prenatal visits as told by your health  care provider. This is important. Safety Wear your seat belt at all times when driving. Make a list of emergency phone numbers, including numbers for family, friends, the hospital, and police and fire departments. General instructions Ask your health care provider for a referral to a local prenatal education class. Begin classes no later than the beginning of month 6 of your pregnancy. Ask for help if you have counseling or nutritional needs during pregnancy. Your health care provider can offer advice or refer you to specialists for help with various needs. Do not use hot tubs, steam rooms, or saunas. Do not douche or use tampons or scented sanitary pads. Do not cross your legs for long periods of time. Avoid cat litter boxes and soil used by cats. These carry germs that can cause birth defects in the baby and possibly loss of the   fetus by miscarriage or stillbirth. Avoid all smoking, herbs, alcohol, and unprescribed drugs. Chemicals in these products can affect the formation and growth of the baby. Do not use any products that contain nicotine or tobacco, such as cigarettes and e-cigarettes. If you need help quitting, ask your health care provider. Visit your dentist if you have not gone yet during your pregnancy. Use a soft toothbrush to brush your teeth and be gentle when you floss. Contact a health care provider if: You have dizziness. You have mild pelvic cramps, pelvic pressure, or nagging pain in the abdominal area. You have persistent nausea, vomiting, or diarrhea. You have a bad smelling vaginal discharge. You have pain when you urinate. Get help right away if: You have a fever. You are leaking fluid from your vagina. You have spotting or bleeding from your vagina. You have severe abdominal cramping or pain. You have rapid weight gain or weight loss. You have shortness of breath with chest pain. You notice sudden or extreme swelling of your face, hands, ankles, feet, or legs. You  have not felt your baby move in over an hour. You have severe headaches that do not go away when you take medicine. You have vision changes. Summary The second trimester is from week 14 through week 27 (months 4 through 6). It is also a time when the fetus is growing rapidly. Your body goes through many changes during pregnancy. The changes vary from woman to woman. Avoid all smoking, herbs, alcohol, and unprescribed drugs. These chemicals affect the formation and growth your baby. Do not use any tobacco products, such as cigarettes, chewing tobacco, and e-cigarettes. If you need help quitting, ask your health care provider. Contact your health care provider if you have any questions. Keep all prenatal visits as told by your health care provider. This is important. This information is not intended to replace advice given to you by your health care provider. Make sure you discuss any questions you have with your health care provider. Document Released: 10/16/2001 Document Revised: 03/29/2016 Document Reviewed: 12/23/2012 Elsevier Interactive Patient Education  2017 [employer]vier Inc.  

## 2021-05-05 NOTE — Progress Notes (Signed)
   LOW-RISK PREGNANCY VISIT Patient name: Melinda Wright MRN [id number]  Date of birth: 05-17-1996 Chief Complaint:   Routine Prenatal Visit (Anatomy scan/)  History of Present Illness:   Melinda Wright is a 25 y.o. G1P0 female at [redacted]w[redacted]d with an Estimated Date of Delivery: 10/05/21 being seen today for ongoing management of a low-risk pregnancy.  Today she reports  doing well . Contractions: Not present. Vag. Bleeding: None.   . denies leaking of fluid. Review of Systems:   Pertinent items are noted in HPI Denies abnormal vaginal discharge w/ itching/odor/irritation, headaches, visual changes, shortness of breath, chest pain, abdominal pain, severe nausea/vomiting, or problems with urination or bowel movements unless otherwise stated above. Pertinent History Reviewed:  Reviewed past medical,surgical, social, obstetrical and family history.  Reviewed problem list, medications and allergies. Physical Assessment:   Vitals:   05/05/21 0915  BP: 120/65  Pulse: 94  There is no height or weight on file to calculate BMI.        Physical Examination:   General appearance: Well appearing, and in no distress  Mental status: Alert, oriented to person, place, and time  Skin: Warm & dry  Cardiovascular: Normal heart rate noted  Respiratory: Normal respiratory effort, no distress  Abdomen: Soft, gravid, nontender  Pelvic: Cervical exam deferred         Extremities: Edema: None  Fetal Status: Fetal Heart Rate (bpm): 137 u/s        Anatomy scan: [place] 18+1 wks,cephalic,posterior placenta gr 0,normal ovaries,cx 3.6 cm,fhr 137 bpm,SVP of fluid 4.3 cm,EFW 237 g 59%,anatomy complete,no obvious abnormalities  No results found for this or any previous visit (from the past 24 hour(s)).  Assessment & Plan:  1) Low-risk pregnancy G1P0 at [redacted]w[redacted]d with an Estimated Date of Delivery: 10/05/21     Meds: No orders of the defined types were placed in this encounter.  Labs/procedures today: anatomy u/s; 2nd IT;  SCT  Plan:  Continue routine obstetrical care   Reviewed: Preterm labor symptoms and general obstetric precautions including but not limited to vaginal bleeding, contractions, leaking of fluid and fetal movement were reviewed in detail with the patient.  All questions were answered. Has home bp cuff.  Check bp weekly, let us know if >140/90.   Follow-up: No follow-ups on file.  Orders Placed This Encounter  Procedures   INTEGRATED 2   Hgb Fractionation [place]   Arabella Merles [facility] 05/05/2021 9:48 AM

## 2021-05-05 NOTE — Progress Notes (Signed)
[place] 18+1 wks,cephalic,posterior placenta gr 0,normal ovaries,cx 3.6 cm,fhr 137 bpm,SVP of fluid 4.3 cm,EFW 237 g 59%,anatomy complete,no obvious abnormalities

## 2021-05-09 LAB — INTEGRATED 2
AFP MoM: 1.03
Alpha-Fetoprotein: 44.5 ng/mL
Crown Rump Length: 74 mm
DIA MoM: 0.61
DIA Value: 91.7 pg/mL
Estriol, Unconjugated: 1.34 ng/mL
Gest. Age on Collection Date: 13.3 weeks
Gestational Age: 18.3 weeks
Maternal Age at EDD: 23.9 yr
Nuchal Translucency (NT): 1.4 mm
Nuchal Translucency MoM: 0.82
Number of Fetuses: 1
PAPP-A MoM: 1.07
PAPP-A Value: 1327.5 ng/mL
Test Results:: NEGATIVE
Weight: 159 [lb_av]
Weight: 159 [lb_av]
hCG MoM: 0.82
hCG Value: 20.4 IU/mL
uE3 MoM: 0.93

## 2021-05-09 LAB — HGB FRACTIONATION CASCADE
Hgb A2: 2.6 % (ref 1.8–3.2)
Hgb A: 97.4 % (ref 96.4–98.8)
Hgb F: 0 % (ref 0.0–2.0)
Hgb S: 0 %

## 2021-06-02 ENCOUNTER — Encounter: Payer: Self-pay | Admitting: Obstetrics and Gynecology

## 2021-06-02 ENCOUNTER — Ambulatory Visit (INDEPENDENT_AMBULATORY_CARE_PROVIDER_SITE_OTHER): Payer: BC Managed Care – PPO | Admitting: Obstetrics and Gynecology

## 2021-06-02 ENCOUNTER — Other Ambulatory Visit: Payer: Self-pay

## 2021-06-02 VITALS — BP 112/71 | HR 75 | Wt 169.2 lb

## 2021-06-02 DIAGNOSIS — Z3402 Encounter for supervision of normal first pregnancy, second trimester: Secondary | ICD-10-CM

## 2021-06-02 DIAGNOSIS — Z3A22 22 weeks gestation of pregnancy: Secondary | ICD-10-CM

## 2021-06-02 NOTE — Patient Instructions (Signed)

## 2021-06-02 NOTE — Progress Notes (Signed)
Subjective:  Melinda Wright is a 25 y.o. G1P0 at [redacted]w[redacted]d being seen today for ongoing prenatal care.  She is currently monitored for the following issues for this low-risk pregnancy and has Supervision of normal first pregnancy on their problem list.  Patient reports no complaints.  Contractions: Not present.  .  Movement: Present. Denies leaking of fluid.   The following portions of the patient's history were reviewed and updated as appropriate: allergies, current medications, past family history, past medical history, past social history, past surgical history and problem list. Problem list updated.  Objective:   Vitals:   06/02/21 1018  BP: 112/71  Pulse: 75  Weight: 169 lb 3.2 oz (76.7 kg)    Fetal Status:     Movement: Present     General:  Alert, oriented and cooperative. Patient is in no acute distress.  Skin: Skin is warm and dry. No rash noted.   Cardiovascular: Normal heart rate noted  Respiratory: Normal respiratory effort, no problems with respiration noted  Abdomen: Soft, gravid, appropriate for gestational age. Pain/Pressure: Present     Pelvic:  Cervical exam deferred        Extremities: Normal range of motion.  Edema: None  Mental Status: Normal mood and affect. Normal behavior. Normal judgment and thought content.   Urinalysis:      Assessment and Plan:  Pregnancy: G1P0 at [redacted]w[redacted]d  1. Encounter for supervision of normal first pregnancy in second trimester Stable Glucola next visit  Preterm labor symptoms and general obstetric precautions including but not limited to vaginal bleeding, contractions, leaking of fluid and fetal movement were reviewed in detail with the patient. Please refer to After Visit Summary for other counseling recommendations.  Return in about 4 weeks (around 06/30/2021) for OB visit, face to face, any provider, fasting for Glucola.   Hermina Staggers, MD

## 2021-06-30 ENCOUNTER — Other Ambulatory Visit: Payer: Self-pay

## 2021-06-30 ENCOUNTER — Encounter: Payer: Self-pay | Admitting: Obstetrics & Gynecology

## 2021-06-30 ENCOUNTER — Ambulatory Visit (INDEPENDENT_AMBULATORY_CARE_PROVIDER_SITE_OTHER): Payer: BC Managed Care – PPO | Admitting: Obstetrics & Gynecology

## 2021-06-30 ENCOUNTER — Other Ambulatory Visit: Payer: BC Managed Care – PPO

## 2021-06-30 VITALS — BP 111/72 | HR 74 | Wt 173.0 lb

## 2021-06-30 DIAGNOSIS — Z3402 Encounter for supervision of normal first pregnancy, second trimester: Secondary | ICD-10-CM

## 2021-06-30 DIAGNOSIS — Z3A26 26 weeks gestation of pregnancy: Secondary | ICD-10-CM

## 2021-06-30 DIAGNOSIS — Z3482 Encounter for supervision of other normal pregnancy, second trimester: Secondary | ICD-10-CM | POA: Diagnosis not present

## 2021-06-30 DIAGNOSIS — Z131 Encounter for screening for diabetes mellitus: Secondary | ICD-10-CM | POA: Diagnosis not present

## 2021-06-30 NOTE — Progress Notes (Signed)
   LOW-RISK PREGNANCY VISIT Patient name: Melinda Wright MRN [id number]  Date of birth: Mar 09, 1996 Chief Complaint:   Routine Prenatal Visit (PN2 today)  History of Present Illness:   Melinda Wright is a 25 y.o. G1P0 female at [redacted]w[redacted]d with an Estimated Date of Delivery: 10/05/21 being seen today for ongoing management of a low-risk pregnancy.  Depression screen [facility] 2/9 06/30/2021 04/04/2021 02/16/2021  Decreased Interest 0 0 0  Down, Depressed, Hopeless 0 0 0  PHQ - 2 Score 0 0 0  Altered sleeping 1 1 2   Tired, decreased energy 1 0 2  Change in appetite 0 0 0  Feeling bad or failure about yourself  0 0 0  Trouble concentrating 0 0 0  Moving slowly or fidgety/restless 0 0 0  Suicidal thoughts 0 0 0  PHQ-9 Score 2 1 4     Today she reports no complaints. Contractions: Not present. Vag. Bleeding: None.  Movement: Present. denies leaking of fluid. Review of Systems:   Pertinent items are noted in HPI Denies abnormal vaginal discharge w/ itching/odor/irritation, headaches, visual changes, shortness of breath, chest pain, abdominal pain, severe nausea/vomiting, or problems with urination or bowel movements unless otherwise stated above. Pertinent History Reviewed:  Reviewed past medical,surgical, social, obstetrical and family history.  Reviewed problem list, medications and allergies. Physical Assessment:   Vitals:   06/30/21 0847  BP: 111/72  Pulse: 74  Weight: 173 lb (78.5 kg)  Body mass index is 28.79 kg/m.        Physical Examination:   General appearance: Well appearing, and in no distress  Mental status: Alert, oriented to person, place, and time  Skin: Warm & dry  Cardiovascular: Normal heart rate noted  Respiratory: Normal respiratory effort, no distress  Abdomen: Soft, gravid, nontender  Pelvic: Cervical exam deferred         Extremities: Edema: Trace  Fetal Status: Fetal Heart Rate (bpm): 135 Fundal Height: 26 cm Movement: Present    Chaperone: n/a    No results  found for this or any previous visit (from the past 24 hour(s)).  Assessment & Plan:  1) Low-risk pregnancy G1P0 at [redacted]w[redacted]d with an Estimated Date of Delivery: 10/05/21      Meds: No orders of the defined types were placed in this encounter.  Labs/procedures today: PN2  Plan:  Continue routine obstetrical care  Next visit: prefers in person    Reviewed: Preterm labor symptoms and general obstetric precautions including but not limited to vaginal bleeding, contractions, leaking of fluid and fetal movement were reviewed in detail with the patient.  All questions were answered. Has home bp cuff. Rx faxed to . Check bp weekly, let [redacted]w[redacted]d know if >140/90.   Follow-up: Return in about 4 weeks (around 07/28/2021).  No orders of the defined types were placed in this encounter.   [place], MD 06/30/2021 9:25 AM

## 2021-07-01 LAB — CBC
Hematocrit: 36.2 % (ref 34.0–46.6)
Hemoglobin: 11.9 g/dL (ref 11.1–15.9)
MCH: 29.2 pg (ref 26.6–33.0)
MCHC: 32.9 g/dL (ref 31.5–35.7)
MCV: 89 fL (ref 79–97)
Platelets: 258 10*3/uL (ref 150–450)
RBC: 4.08 x10E6/uL (ref 3.77–5.28)
RDW: 12.1 % (ref 11.7–15.4)
WBC: 8.7 10*3/uL (ref 3.4–10.8)

## 2021-07-01 LAB — HIV ANTIBODY (ROUTINE TESTING W REFLEX): HIV Screen 4th Generation wRfx: NONREACTIVE

## 2021-07-01 LAB — RPR: RPR Ser Ql: NONREACTIVE

## 2021-07-01 LAB — ANTIBODY SCREEN: Antibody Screen: NEGATIVE

## 2021-07-01 LAB — GLUCOSE TOLERANCE, 2 HOURS W/ 1HR
Glucose, 1 hour: 136 mg/dL (ref 65–179)
Glucose, 2 hour: 109 mg/dL (ref 65–152)
Glucose, Fasting: 81 mg/dL (ref 65–91)

## 2021-07-19 ENCOUNTER — Ambulatory Visit (INDEPENDENT_AMBULATORY_CARE_PROVIDER_SITE_OTHER): Payer: BC Managed Care – PPO

## 2021-07-19 ENCOUNTER — Other Ambulatory Visit: Payer: Self-pay

## 2021-07-19 VITALS — BP 117/72 | HR 81 | Wt 171.0 lb

## 2021-07-19 DIAGNOSIS — Z1389 Encounter for screening for other disorder: Secondary | ICD-10-CM

## 2021-07-19 DIAGNOSIS — Z0130 Encounter for examination of blood pressure without abnormal findings: Secondary | ICD-10-CM

## 2021-07-19 DIAGNOSIS — R55 Syncope and collapse: Secondary | ICD-10-CM | POA: Diagnosis not present

## 2021-07-19 DIAGNOSIS — Z3403 Encounter for supervision of normal first pregnancy, third trimester: Secondary | ICD-10-CM

## 2021-07-19 LAB — POCT URINALYSIS DIPSTICK OB
Blood, UA: NEGATIVE
Glucose, UA: NEGATIVE
Ketones, UA: NEGATIVE
Leukocytes, UA: NEGATIVE
Nitrite, UA: NEGATIVE
POC,PROTEIN,UA: NEGATIVE

## 2021-07-19 LAB — POCT HEMOGLOBIN: Hemoglobin: 11.4 g/dL (ref 11–14.6)

## 2021-07-19 NOTE — Progress Notes (Addendum)
   NURSE VISIT- BLOOD PRESSURE CHECK  SUBJECTIVE:  Melinda Wright is a 25 y.o. G1P0 female here for BP check. She is [redacted]w[redacted]d pregnant    HYPERTENSION ROS:  Pregnant:  Severe headaches that don't go away with tylenol/other medicines: No  Visual changes (seeing spots/double/blurred vision) No  Severe pain under right breast breast or in center of upper chest No  Severe nausea/vomiting No  Taking medicines as instructed not applicable  OBJECTIVE:  LMP 12/29/2020   Appearance alert, well appearing, and in no distress, oriented to person, place, and time, and normal appearing weight. HBG: 11.4 UA: neg  ASSESSMENT: Pregnancy [redacted]w[redacted]d  blood pressure check  PLAN: Discussed with Dr. Charlotta Newton   Recommendations: no changes needed   Follow-up: as scheduled   Melinda Wright A Efosa Treichler  07/19/2021 11:55 AM   Chart reviewed for nurse visit. Agree with plan of care.  Melinda Hidalgo, DO 07/19/2021 3:07 PM

## 2021-07-28 ENCOUNTER — Encounter: Payer: BC Managed Care – PPO | Admitting: Advanced Practice Midwife

## 2021-08-04 ENCOUNTER — Other Ambulatory Visit: Payer: Self-pay

## 2021-08-04 ENCOUNTER — Ambulatory Visit (INDEPENDENT_AMBULATORY_CARE_PROVIDER_SITE_OTHER): Payer: BC Managed Care – PPO | Admitting: Women's Health

## 2021-08-04 ENCOUNTER — Encounter: Payer: Self-pay | Admitting: Women's Health

## 2021-08-04 VITALS — BP 129/73 | HR 85 | Wt 174.8 lb

## 2021-08-04 DIAGNOSIS — Z23 Encounter for immunization: Secondary | ICD-10-CM

## 2021-08-04 DIAGNOSIS — Z3403 Encounter for supervision of normal first pregnancy, third trimester: Secondary | ICD-10-CM

## 2021-08-04 DIAGNOSIS — Z3A31 31 weeks gestation of pregnancy: Secondary | ICD-10-CM

## 2021-08-04 NOTE — Patient Instructions (Signed)
[place], thank you for choosing our office today! We appreciate the opportunity to meet your healthcare needs. You may receive a short survey by mail, e-mail, or through [employer]. If you are happy with your care we would appreciate if you could take just a few minutes to complete the survey questions. We read all of your comments and take your feedback very seriously. Thank you again for choosing our office.  Center for [employer] Team at [facility]  [facility] at [facility] ([address] [place], [place] [address]) Entrance C, located off of [address] Free 24/7 valet parking   CLASSES: Go to [email or web address] to register for classes (childbirth, breastfeeding, waterbirth, infant CPR, daddy bootcamp, etc.)  Call the office [phone]) or go to [facility] if: You begin to have strong, frequent contractions Your water breaks.  Sometimes it is a big gush of fluid, sometimes it is just a trickle that keeps getting your panties wet or running down your legs You have vaginal bleeding.  It is normal to have a small amount of spotting if your cervix was checked.  You don't feel your baby moving like normal.  If you don't, get you something to eat and drink and lay down and focus on feeling your baby move.   If your baby is still not moving like normal, you should call the office or go to [facility].  Call the office [phone]) or go to [facility] hospital for these signs of pre-eclampsia: Severe headache that does not go away with Tylenol Visual changes- seeing spots, double, blurred vision Pain under your right breast or upper abdomen that does not go away with Tums or heartburn medicine Nausea and/or vomiting Severe swelling in your hands, feet, and face   Tdap Vaccine It is recommended that you get the Tdap vaccine during the third trimester of EACH pregnancy to help protect your baby from getting pertussis (whooping cough) 27-36 weeks is the BEST time to do  this so that you can pass the protection on to your baby. During pregnancy is better than after pregnancy, but if you are unable to get it during pregnancy it will be offered at the hospital.  You can get this vaccine with [place], at the health department, your family doctor, or some local pharmacies Everyone who will be around your baby should also be up-to-date on their vaccines before the baby comes. Adults (who are not pregnant) only need 1 dose of Tdap during adulthood.   [facility] Pediatricians/Family Doctors [facility]): [address] Colette Ribas, [phone]           [facility]: [address], [phone]                [facility] Medicine [facility]): [address], [phone] (call to ask if accepting patients) [facility]: [address], [place], [phone]    [facility] Pediatricians/Family Doctors [facility]): [phone] [address], [phone] [facility]: [address] [place], [phone] [facility]: [address], [phone]  [facility] Doctors  [facility]): [phone] [facility]: [address], [phone]   [facility] Doctors [facility]: [address] [phone]  [facility] Family Doctors  [facility]: [phone], [phone]  Home Blood Pressure Monitoring for Patients   Your provider has recommended that you check your  blood pressure (BP) at least once a week at home. If you do not have a blood pressure cuff at home, one will be provided for you. Contact your provider if you have not received your monitor within 1 week.   Helpful Tips for Accurate Home Blood Pressure Checks  Don't smoke, exercise, or drink caffeine 30 minutes before checking your BP Use the restroom before checking your BP (a full bladder can raise your  pressure) Relax in a comfortable upright chair Feet on the ground Left arm resting comfortably on a flat surface at the level of your heart Legs uncrossed Back supported Sit quietly and don't talk Place the cuff on your bare arm Adjust snuggly, so that only two fingertips can fit between your skin and the top of the cuff Check 2 readings separated by at least one minute Keep a log of your BP readings For a visual, please reference this diagram: http://ccnc.care/bpdiagram  Provider Name: [facility]     Phone: [phone]  Zone 1: ALL CLEAR  Continue to monitor your symptoms:  BP reading is less than 140 (top number) or less than 90 (bottom number)  No right upper stomach pain No headaches or seeing spots No feeling nauseated or throwing up No swelling in face and hands  Zone 2: CAUTION Call your doctor's office for any of the following:  BP reading is greater than 140 (top number) or greater than 90 (bottom number)  Stomach pain under your ribs in the middle or right side Headaches or seeing spots Feeling nauseated or throwing up Swelling in face and hands  Zone 3: EMERGENCY  Seek immediate medical care if you have any of the following:  BP reading is greater than160 (top number) or greater than 110 (bottom number) Severe headaches not improving with Tylenol Serious difficulty catching your breath Any worsening symptoms from Zone 2   Third Trimester of Pregnancy The third trimester is from week 29 through week 42, months 7 through 9. The third trimester is a time when the fetus is growing rapidly. At the end of the ninth month, the fetus is about 20 inches in length and weighs 6-10 pounds.  BODY CHANGES Your body goes through many changes during pregnancy. The changes vary from woman to woman.  Your weight will continue to increase. You can expect to gain 25-35 pounds (11-16 kg) by the end of the pregnancy. You may begin to get stretch marks on your hips, abdomen,  and breasts. You may urinate more often because the fetus is moving lower into your pelvis and pressing on your bladder. You may develop or continue to have heartburn as a result of your pregnancy. You may develop constipation because certain hormones are causing the muscles that push waste through your intestines to slow down. You may develop hemorrhoids or swollen, bulging veins (varicose veins). You may have pelvic pain because of the weight gain and pregnancy hormones relaxing your joints between the bones in your pelvis. Backaches may result from overexertion of the muscles supporting your posture. You may have changes in your hair. These can include thickening of your hair, rapid growth, and changes in texture. Some women also have hair loss during or after pregnancy, or hair that feels dry or thin. Your hair will most likely return to normal after your baby is born. Your breasts will continue to grow and be tender. A yellow discharge may leak from your breasts called colostrum. Your belly button may stick out. You may   feel short of breath because of your expanding uterus. You may notice the fetus "dropping," or moving lower in your abdomen. You may have a bloody mucus discharge. This usually occurs a few days to a week before labor begins. Your cervix becomes thin and soft (effaced) near your due date. WHAT TO EXPECT AT YOUR PRENATAL EXAMS  You will have prenatal exams every 2 weeks until week 36. Then, you will have weekly prenatal exams. During a routine prenatal visit: You will be weighed to make sure you and the fetus are growing normally. Your blood pressure is taken. Your abdomen will be measured to track your baby's growth. The fetal heartbeat will be listened to. Any test results from the previous visit will be discussed. You may have a cervical check near your due date to see if you have effaced. At around 36 weeks, your caregiver will check your cervix. At the same time, your  caregiver will also perform a test on the secretions of the vaginal tissue. This test is to determine if a type of bacteria, Group B streptococcus, is present. Your caregiver will explain this further. Your caregiver may ask you: What your birth plan is. How you are feeling. If you are feeling the baby move. If you have had any abnormal symptoms, such as leaking fluid, bleeding, severe headaches, or abdominal cramping. If you have any questions. Other tests or screenings that may be performed during your third trimester include: Blood tests that check for low iron levels (anemia). Fetal testing to check the health, activity level, and growth of the fetus. Testing is done if you have certain medical conditions or if there are problems during the pregnancy. FALSE LABOR You may feel small, irregular contractions that eventually go away. These are called Braxton Hicks contractions, or false labor. Contractions may last for hours, days, or even weeks before true labor sets in. If contractions come at regular intervals, intensify, or become painful, it is best to be seen by your caregiver.  SIGNS OF LABOR  Menstrual-like cramps. Contractions that are 5 minutes apart or less. Contractions that start on the top of the uterus and spread down to the lower abdomen and back. A sense of increased pelvic pressure or back pain. A watery or bloody mucus discharge that comes from the vagina. If you have any of these signs before the 37th week of pregnancy, call your caregiver right away. You need to go to the hospital to get checked immediately. HOME CARE INSTRUCTIONS  Avoid all smoking, herbs, alcohol, and unprescribed drugs. These chemicals affect the formation and growth of the baby. Follow your caregiver's instructions regarding medicine use. There are medicines that are either safe or unsafe to take during pregnancy. Exercise only as directed by your caregiver. Experiencing uterine cramps is a good sign to  stop exercising. Continue to eat regular, healthy meals. Wear a good support bra for breast tenderness. Do not use hot tubs, steam rooms, or saunas. Wear your seat belt at all times when driving. Avoid raw meat, uncooked cheese, cat litter boxes, and soil used by cats. These carry germs that can cause birth defects in the baby. Take your prenatal vitamins. Try taking a stool softener (if your caregiver approves) if you develop constipation. Eat more high-fiber foods, such as fresh vegetables or fruit and whole grains. Drink plenty of fluids to keep your urine clear or pale yellow. Take warm sitz baths to soothe any pain or discomfort caused by hemorrhoids. Use hemorrhoid cream if   your caregiver approves. If you develop varicose veins, wear support hose. Elevate your feet for 15 minutes, 3-4 times a day. Limit salt in your diet. Avoid heavy lifting, wear low heal shoes, and practice good posture. Rest a lot with your legs elevated if you have leg cramps or low back pain. Visit your dentist if you have not gone during your pregnancy. Use a soft toothbrush to brush your teeth and be gentle when you floss. A sexual relationship may be continued unless your caregiver directs you otherwise. Do not travel far distances unless it is absolutely necessary and only with the approval of your caregiver. Take prenatal classes to understand, practice, and ask questions about the labor and delivery. Make a trial run to the hospital. Pack your hospital bag. Prepare the baby's nursery. Continue to go to all your prenatal visits as directed by your caregiver. SEEK MEDICAL CARE IF: You are unsure if you are in labor or if your water has broken. You have dizziness. You have mild pelvic cramps, pelvic pressure, or nagging pain in your abdominal area. You have persistent nausea, vomiting, or diarrhea. You have a bad smelling vaginal discharge. You have pain with urination. SEEK IMMEDIATE MEDICAL CARE IF:  You  have a fever. You are leaking fluid from your vagina. You have spotting or bleeding from your vagina. You have severe abdominal cramping or pain. You have rapid weight loss or gain. You have shortness of breath with chest pain. You notice sudden or extreme swelling of your face, hands, ankles, feet, or legs. You have not felt your baby move in over an hour. You have severe headaches that do not go away with medicine. You have vision changes. Document Released: 10/16/2001 Document Revised: 10/27/2013 Document Reviewed: 12/23/2012 ExitCare Patient Information 2015 [employer]. This information is not intended to replace advice given to you by your health care provider. Make sure you discuss any questions you have with your health care provider.       

## 2021-08-04 NOTE — Progress Notes (Signed)
    LOW-RISK PREGNANCY VISIT Patient name: Melinda Wright MRN [id number]  Date of birth: [birth year] Chief Complaint:   Routine Prenatal Visit  History of Present Illness:   Melinda Wright is a 25 y.o. G1P0 female at [redacted]w[redacted]d with an Estimated Date of Delivery: 10/05/21 being seen today for ongoing management of a low-risk pregnancy.   Today she reports no complaints. Contractions: Not present. Vag. Bleeding: None.  Movement: Present. denies leaking of fluid.  Depression screen [facility] 2/9 06/30/2021 04/04/2021 02/16/2021  Decreased Interest 0 0 0  Down, Depressed, Hopeless 0 0 0  PHQ - 2 Score 0 0 0  Altered sleeping 1 1 2   Tired, decreased energy 1 0 2  Change in appetite 0 0 0  Feeling bad or failure about yourself  0 0 0  Trouble concentrating 0 0 0  Moving slowly or fidgety/restless 0 0 0  Suicidal thoughts 0 0 0  PHQ-9 Score 2 1 4      GAD 7 : Generalized Anxiety Score 06/30/2021 04/04/2021 02/16/2021  Nervous, Anxious, on Edge 0 0 0  Control/stop worrying 0 0 0  Worry too much - different things 0 0 0  Trouble relaxing 0 0 0  Restless 0 0 0  Easily annoyed or irritable 0 0 0  Afraid - awful might happen 0 0 0  Total GAD 7 Score 0 0 0      Review of Systems:   Pertinent items are noted in HPI Denies abnormal vaginal discharge w/ itching/odor/irritation, headaches, visual changes, shortness of breath, chest pain, abdominal pain, severe nausea/vomiting, or problems with urination or bowel movements unless otherwise stated above. Pertinent History Reviewed:  Reviewed past medical,surgical, social, obstetrical and family history.  Reviewed problem list, medications and allergies. Physical Assessment:   Vitals:   08/04/21 0918  BP: 129/73  Pulse: 85  Weight: 174 lb 12.8 oz (79.3 kg)  Body mass index is 29.09 kg/m.        Physical Examination:   General appearance: Well appearing, and in no distress  Mental status: Alert, oriented to person, place, and time  Skin: Warm &  dry  Cardiovascular: Normal heart rate noted  Respiratory: Normal respiratory effort, no distress  Abdomen: Soft, gravid, nontender  Pelvic: Cervical exam deferred         Extremities: Edema: Trace  Fetal Status: Fetal Heart Rate (bpm): 148 Fundal Height: 31 cm Movement: Present    Chaperone: N/A   No results found for this or any previous visit (from the past 24 hour(s)).  Assessment & Plan:  1) Low-risk pregnancy G1P0 at [redacted]w[redacted]d with an Estimated Date of Delivery: 10/05/21    Meds: No orders of the defined types were placed in this encounter.  Labs/procedures today: tdap and declined flu shot  Plan:  Continue routine obstetrical care  Next visit: prefers in person    Reviewed: Preterm labor symptoms and general obstetric precautions including but not limited to vaginal bleeding, contractions, leaking of fluid and fetal movement were reviewed in detail with the patient.  All questions were answered. Does have home bp cuff. Office bp cuff given: not applicable. Check bp weekly, let [redacted]w[redacted]d know if consistently >140 and/or >90.  Follow-up: Return in about 2 weeks (around 08/18/2021) for LROB, CNM, in person.  No future appointments.  Orders Placed This Encounter  Procedures   Tdap vaccine greater than or equal to 7yo IM   [place] CNM, [facility] 08/04/2021 9:38 AM

## 2021-08-17 ENCOUNTER — Encounter: Payer: Self-pay | Admitting: Obstetrics & Gynecology

## 2021-08-17 ENCOUNTER — Ambulatory Visit (INDEPENDENT_AMBULATORY_CARE_PROVIDER_SITE_OTHER): Payer: BC Managed Care – PPO | Admitting: Obstetrics & Gynecology

## 2021-08-17 ENCOUNTER — Other Ambulatory Visit: Payer: Self-pay

## 2021-08-17 VITALS — BP 131/82 | HR 72 | Wt 177.0 lb

## 2021-08-17 DIAGNOSIS — O368130 Decreased fetal movements, third trimester, not applicable or unspecified: Secondary | ICD-10-CM

## 2021-08-17 DIAGNOSIS — Z3403 Encounter for supervision of normal first pregnancy, third trimester: Secondary | ICD-10-CM

## 2021-08-17 NOTE — Progress Notes (Signed)
   LOW-RISK PREGNANCY VISIT Patient name: Melinda Wright MRN [id number]  Date of birth: Jul 11, 1996 Chief Complaint:   Routine Prenatal Visit  History of Present Illness:   Melinda Wright is a 25 y.o. G1P0 female at [redacted]w[redacted]d with an Estimated Date of Delivery: 10/05/21 being seen today for ongoing management of a low-risk pregnancy.  Depression screen [facility] 2/9 06/30/2021 04/04/2021 02/16/2021  Decreased Interest 0 0 0  Down, Depressed, Hopeless 0 0 0  PHQ - 2 Score 0 0 0  Altered sleeping 1 1 2   Tired, decreased energy 1 0 2  Change in appetite 0 0 0  Feeling bad or failure about yourself  0 0 0  Trouble concentrating 0 0 0  Moving slowly or fidgety/restless 0 0 0  Suicidal thoughts 0 0 0  PHQ-9 Score 2 1 4     Today she reports decreased fetal movement. Contractions: Not present. Vag. Bleeding: None.  Movement: (!) Decreased. denies leaking of fluid. Review of Systems:   Pertinent items are noted in HPI Denies abnormal vaginal discharge w/ itching/odor/irritation, headaches, visual changes, shortness of breath, chest pain, abdominal pain, severe nausea/vomiting, or problems with urination or bowel movements unless otherwise stated above. Pertinent History Reviewed:  Reviewed past medical,surgical, social, obstetrical and family history.  Reviewed problem list, medications and allergies. Physical Assessment:   Vitals:   08/17/21 1506  BP: 131/82  Pulse: 72  Weight: 177 lb (80.3 kg)  Body mass index is 29.45 kg/m.        Physical Examination:   General appearance: Well appearing, and in no distress  Mental status: Alert, oriented to person, place, and time  Skin: Warm & dry  Cardiovascular: Normal heart rate noted  Respiratory: Normal respiratory effort, no distress  Abdomen: Soft, gravid, nontender  Pelvic: Cervical exam deferred         Extremities: Edema: Trace  Fetal Status:     Movement: (!) Decreased    Reactive NST  Melinda Wright is at [redacted]w[redacted]d Estimated Date of  Delivery: 10/05/21  NST being performed due to decreased fetal movement  Today the NST is Reactive  Fetal Monitoring:  Baseline: 140 bpm, Variability: Good {> 6 bpm), Accelerations: Reactive, and Decelerations: Absent   reactive  The accelerations are >15 bpm and more than 2 in 20 minutes  Final diagnosis:  Reactive NST  [redacted]w[redacted]d, MD    Chaperone: 14/1/22 Rash    No results found for this or any previous visit (from the past 24 hour(s)).  Assessment & Plan:  1) Low-risk pregnancy G1P0 at [redacted]w[redacted]d with an Estimated Date of Delivery: 10/05/21   2) Reactive NST,    Meds: No orders of the defined types were placed in this encounter.  Labs/procedures today:   Plan:  Continue routine obstetrical care , discussed daily kick counts Next visit: prefers in person    Reviewed: Preterm labor symptoms and general obstetric precautions including but not limited to vaginal bleeding, contractions, leaking of fluid and fetal movement were reviewed in detail with the patient.  All questions were answered. Has home bp cuff. Rx faxed to . Check bp weekly, let [redacted]w[redacted]d know if >140/90.   Follow-up: No follow-ups on file.  No orders of the defined types were placed in this encounter.   14/1/22, MD 08/17/2021 4:02 PM

## 2021-08-31 ENCOUNTER — Encounter: Payer: Self-pay | Admitting: Women's Health

## 2021-08-31 ENCOUNTER — Ambulatory Visit (INDEPENDENT_AMBULATORY_CARE_PROVIDER_SITE_OTHER): Payer: BC Managed Care – PPO | Admitting: Women's Health

## 2021-08-31 ENCOUNTER — Other Ambulatory Visit: Payer: Self-pay

## 2021-08-31 VITALS — BP 123/75 | HR 80 | Wt 183.0 lb

## 2021-08-31 DIAGNOSIS — Z3403 Encounter for supervision of normal first pregnancy, third trimester: Secondary | ICD-10-CM

## 2021-08-31 NOTE — Patient Instructions (Signed)
[place], thank you for choosing our office today! We appreciate the opportunity to meet your healthcare needs. You may receive a short survey by mail, e-mail, or through [employer]. If you are happy with your care we would appreciate if you could take just a few minutes to complete the survey questions. We read all of your comments and take your feedback very seriously. Thank you again for choosing our office.  Center for [employer] Team at [facility]  [facility] at [facility] ([address] [place], [place] [address]) Entrance C, located off of [address] Free 24/7 valet parking   CLASSES: Go to [email or web address] to register for classes (childbirth, breastfeeding, waterbirth, infant CPR, daddy bootcamp, etc.)  Call the office [phone]) or go to [facility] if: You begin to have strong, frequent contractions Your water breaks.  Sometimes it is a big gush of fluid, sometimes it is just a trickle that keeps getting your panties wet or running down your legs You have vaginal bleeding.  It is normal to have a small amount of spotting if your cervix was checked.  You don't feel your baby moving like normal.  If you don't, get you something to eat and drink and lay down and focus on feeling your baby move.   If your baby is still not moving like normal, you should call the office or go to [facility].  Call the office [phone]) or go to [facility] hospital for these signs of pre-eclampsia: Severe headache that does not go away with Tylenol Visual changes- seeing spots, double, blurred vision Pain under your right breast or upper abdomen that does not go away with Tums or heartburn medicine Nausea and/or vomiting Severe swelling in your hands, feet, and face   Tdap Vaccine It is recommended that you get the Tdap vaccine during the third trimester of EACH pregnancy to help protect your baby from getting pertussis (whooping cough) 27-36 weeks is the BEST time to do  this so that you can pass the protection on to your baby. During pregnancy is better than after pregnancy, but if you are unable to get it during pregnancy it will be offered at the hospital.  You can get this vaccine with [place], at the health department, your family doctor, or some local pharmacies Everyone who will be around your baby should also be up-to-date on their vaccines before the baby comes. Adults (who are not pregnant) only need 1 dose of Tdap during adulthood.   [facility] Pediatricians/Family Doctors [facility]): [address] Colette Ribas, [phone]           [facility]: [address], [phone]                [facility] Medicine [facility]): [address], [phone] (call to ask if accepting patients) [facility]: [address], [place], [phone]    [facility] Pediatricians/Family Doctors [facility]): [phone] [address], [phone] [facility]: [address] [place], [phone] [facility]: [address], [phone]  [facility] Doctors  [facility]): [phone] [facility]: [address], [phone]   [facility] Doctors [facility]: [address] [phone]  [facility] Family Doctors  [facility]: [phone], [phone]  Home Blood Pressure Monitoring for Patients   Your provider has recommended that you check your  blood pressure (BP) at least once a week at home. If you do not have a blood pressure cuff at home, one will be provided for you. Contact your provider if you have not received your monitor within 1 week.   Helpful Tips for Accurate Home Blood Pressure Checks  Don't smoke, exercise, or drink caffeine 30 minutes before checking your BP Use the restroom before checking your BP (a full bladder can raise your  pressure) Relax in a comfortable upright chair Feet on the ground Left arm resting comfortably on a flat surface at the level of your heart Legs uncrossed Back supported Sit quietly and don't talk Place the cuff on your bare arm Adjust snuggly, so that only two fingertips can fit between your skin and the top of the cuff Check 2 readings separated by at least one minute Keep a log of your BP readings For a visual, please reference this diagram: http://ccnc.care/bpdiagram  Provider Name: [facility]     Phone: [phone]  Zone 1: ALL CLEAR  Continue to monitor your symptoms:  BP reading is less than 140 (top number) or less than 90 (bottom number)  No right upper stomach pain No headaches or seeing spots No feeling nauseated or throwing up No swelling in face and hands  Zone 2: CAUTION Call your doctor's office for any of the following:  BP reading is greater than 140 (top number) or greater than 90 (bottom number)  Stomach pain under your ribs in the middle or right side Headaches or seeing spots Feeling nauseated or throwing up Swelling in face and hands  Zone 3: EMERGENCY  Seek immediate medical care if you have any of the following:  BP reading is greater than160 (top number) or greater than 110 (bottom number) Severe headaches not improving with Tylenol Serious difficulty catching your breath Any worsening symptoms from Zone 2  Preterm Labor and Birth Information  The normal length of a pregnancy is 39-41 weeks. Preterm labor is when labor starts before 37 completed weeks of pregnancy. What are the risk factors for preterm labor? Preterm labor is more likely to occur in women who: Have certain infections during pregnancy such as a bladder infection, sexually transmitted infection, or infection inside the uterus (chorioamnionitis). Have a shorter-than-normal cervix. Have gone into preterm labor before. Have had surgery on their cervix. Are younger than age 17  or older than age 35. Are African American. Are pregnant with twins or multiple babies (multiple gestation). Take street drugs or smoke while pregnant. Do not gain enough weight while pregnant. Became pregnant shortly after having been pregnant. What are the symptoms of preterm labor? Symptoms of preterm labor include: Cramps similar to those that can happen during a menstrual period. The cramps may happen with diarrhea. Pain in the abdomen or lower back. Regular uterine contractions that may feel like tightening of the abdomen. A feeling of increased pressure in the pelvis. Increased watery or bloody mucus discharge from the vagina. Water breaking (ruptured amniotic sac). Why is it important to recognize signs of preterm labor? It is important to recognize signs of preterm labor because babies who are born prematurely may not be fully developed. This can put them at an increased risk for: Long-term (chronic) heart and lung problems. Difficulty immediately after birth with regulating body systems, including blood sugar, body temperature, heart rate, and breathing rate. Bleeding in the brain. Cerebral palsy. Learning difficulties. Death. These risks are highest for babies who are born before 34 weeks   of pregnancy. How is preterm labor treated? Treatment depends on the length of your pregnancy, your condition, and the health of your baby. It may involve: Having a stitch (suture) placed in your cervix to prevent your cervix from opening too early (cerclage). Taking or being given medicines, such as: Hormone medicines. These may be given early in pregnancy to help support the pregnancy. Medicine to stop contractions. Medicines to help mature the baby's lungs. These may be prescribed if the risk of delivery is high. Medicines to prevent your baby from developing cerebral palsy. If the labor happens before 34 weeks of pregnancy, you may need to stay in the hospital. What should I do if I  think I am in preterm labor? If you think that you are going into preterm labor, call your health care provider right away. How can I prevent preterm labor in future pregnancies? To increase your chance of having a full-term pregnancy: Do not use any tobacco products, such as cigarettes, chewing tobacco, and e-cigarettes. If you need help quitting, ask your health care provider. Do not use street drugs or medicines that have not been prescribed to you during your pregnancy. Talk with your health care provider before taking any herbal supplements, even if you have been taking them regularly. Make sure you gain a healthy amount of weight during your pregnancy. Watch for infection. If you think that you might have an infection, get it checked right away. Make sure to tell your health care provider if you have gone into preterm labor before. This information is not intended to replace advice given to you by your health care provider. Make sure you discuss any questions you have with your health care provider. Document Revised: 02/13/2019 Document Reviewed: 03/14/2016 Elsevier Patient Education  2020 Elsevier Inc.   

## 2021-08-31 NOTE — Progress Notes (Signed)
LOW-RISK PREGNANCY VISIT Patient name: Melinda Wright MRN [id number]  Date of birth: 19-Sep-1996 Chief Complaint:   Routine Prenatal Visit (Starting to swell; out of breath)  History of Present Illness:   Melinda Wright is a 25 y.o. G1P0 female at [redacted]w[redacted]d with an Estimated Date of Delivery: 10/05/21 being seen today for ongoing management of a low-risk pregnancy.   Today she reports  shortness of breath and swelling. Home bp's wnl.  Contractions: Irritability. Vag. Bleeding: None.  Movement: Present. denies leaking of fluid.  Depression screen [facility] 2/9 06/30/2021 04/04/2021 02/16/2021  Decreased Interest 0 0 0  Down, Depressed, Hopeless 0 0 0  PHQ - 2 Score 0 0 0  Altered sleeping 1 1 2   Tired, decreased energy 1 0 2  Change in appetite 0 0 0  Feeling bad or failure about yourself  0 0 0  Trouble concentrating 0 0 0  Moving slowly or fidgety/restless 0 0 0  Suicidal thoughts 0 0 0  PHQ-9 Score 2 1 4      GAD 7 : Generalized Anxiety Score 06/30/2021 04/04/2021 02/16/2021  Nervous, Anxious, on Edge 0 0 0  Control/stop worrying 0 0 0  Worry too much - different things 0 0 0  Trouble relaxing 0 0 0  Restless 0 0 0  Easily annoyed or irritable 0 0 0  Afraid - awful might happen 0 0 0  Total GAD 7 Score 0 0 0      Review of Systems:   Pertinent items are noted in HPI Denies abnormal vaginal discharge w/ itching/odor/irritation, headaches, visual changes, shortness of breath, chest pain, abdominal pain, severe nausea/vomiting, or problems with urination or bowel movements unless otherwise stated above. Pertinent History Reviewed:  Reviewed past medical,surgical, social, obstetrical and family history.  Reviewed problem list, medications and allergies. Physical Assessment:   Vitals:   08/31/21 1518  BP: 123/75  Pulse: 80  Weight: 183 lb (83 kg)  Body mass index is 30.45 kg/m. O2 sat 98% RA        Physical Examination:   General appearance: Well appearing, and in no  distress  Mental status: Alert, oriented to person, place, and time  Skin: Warm & dry  Cardiovascular: Normal heart rate noted, HRRR  Respiratory: Normal respiratory effort, no distress, LCTAB  Abdomen: Soft, gravid, nontender  Pelvic: Cervical exam deferred         Extremities: Edema: Mild pitting, slight indentation  Fetal Status: Fetal Heart Rate (bpm): 143 Fundal Height: 35 cm Movement: Present    Chaperone: N/A   No results found for this or any previous visit (from the past 24 hour(s)).  Assessment & Plan:  1) Low-risk pregnancy G1P0 at [redacted]w[redacted]d with an Estimated Date of Delivery: 10/05/21   2) Physiological SOB, normal HR, lungs clear, pulse ox wnl  3) Swelling> try compression hose, elevate legs   Meds: No orders of the defined types were placed in this encounter.  Labs/procedures today: none  Plan:  Continue routine obstetrical care  Next visit: prefers will be in person for cultures     Reviewed: Preterm labor symptoms and general obstetric precautions including but not limited to vaginal bleeding, contractions, leaking of fluid and fetal movement were reviewed in detail with the patient.  All questions were answered. Does have home bp cuff. Office bp cuff given: not applicable. Check bp weekly, let [redacted]w[redacted]d know if consistently >140 and/or >90.  Follow-up: Return for weekly, CNM, in person.  Future Appointments  Date Time Provider Department Center  09/08/2021 11:30 AM Arabella Merles, CNM [facility]  09/14/2021  3:30 PM Cheral Marker, CNM [facility]  09/22/2021 10:30 AM Myna Hidalgo, DO [facility]  09/27/2021  4:10 PM Cheral Marker, CNM [facility]    No orders of the defined types were placed in this encounter.  Cheral Marker CNM, [facility] 08/31/2021 3:44 PM

## 2021-09-01 ENCOUNTER — Encounter: Payer: BC Managed Care – PPO | Admitting: Women's Health

## 2021-09-05 ENCOUNTER — Other Ambulatory Visit: Payer: Self-pay

## 2021-09-05 ENCOUNTER — Other Ambulatory Visit (HOSPITAL_COMMUNITY)
Admission: RE | Admit: 2021-09-05 | Discharge: 2021-09-05 | Disposition: A | Discharge status: Home / Self Care | Payer: BC Managed Care – PPO | Source: Ambulatory Visit | Attending: Women's Health | Admitting: Women's Health

## 2021-09-05 ENCOUNTER — Ambulatory Visit (INDEPENDENT_AMBULATORY_CARE_PROVIDER_SITE_OTHER): Payer: BC Managed Care – PPO | Admitting: Women's Health

## 2021-09-05 ENCOUNTER — Encounter: Payer: Self-pay | Admitting: Women's Health

## 2021-09-05 VITALS — BP 125/80 | HR 91 | Wt 181.0 lb

## 2021-09-05 DIAGNOSIS — O26893 Other specified pregnancy related conditions, third trimester: Secondary | ICD-10-CM | POA: Diagnosis not present

## 2021-09-05 DIAGNOSIS — N898 Other specified noninflammatory disorders of vagina: Secondary | ICD-10-CM | POA: Insufficient documentation

## 2021-09-05 DIAGNOSIS — Z3A35 35 weeks gestation of pregnancy: Secondary | ICD-10-CM

## 2021-09-05 DIAGNOSIS — Z3403 Encounter for supervision of normal first pregnancy, third trimester: Secondary | ICD-10-CM | POA: Insufficient documentation

## 2021-09-05 LAB — POCT URINALYSIS DIPSTICK OB
Blood, UA: NEGATIVE
Glucose, UA: NEGATIVE
Ketones, UA: NEGATIVE
Leukocytes, UA: NEGATIVE
Nitrite, UA: NEGATIVE
POC,PROTEIN,UA: NEGATIVE

## 2021-09-05 NOTE — Progress Notes (Signed)
Work-in LOW-RISK PREGNANCY VISIT Patient name: Melinda Wright MRN [id number]  Date of birth: 01/09/1996 Chief Complaint:   Routine Prenatal Visit (W/I leaking fluid?)  History of Present Illness:   Melinda Wright is a 25 y.o. G1P0 female at [redacted]w[redacted]d with an Estimated Date of Delivery: 10/05/21 being seen today for ongoing management of a low-risk pregnancy.   Today she reports leaking fluid since yesterday around lunch time. Felt gush in underwear, wen to bathroom, could see milky stuff leaking in toilet. Denies itching/odor/irritation.  1st bp elevated, repeat normal. Home bp's normal.  Contractions: Irritability. Vag. Bleeding: None.  Movement: Present. reports leaking of fluid.  Depression screen [facility] 2/9 06/30/2021 04/04/2021 02/16/2021  Decreased Interest 0 0 0  Down, Depressed, Hopeless 0 0 0  PHQ - 2 Score 0 0 0  Altered sleeping 1 1 2   Tired, decreased energy 1 0 2  Change in appetite 0 0 0  Feeling bad or failure about yourself  0 0 0  Trouble concentrating 0 0 0  Moving slowly or fidgety/restless 0 0 0  Suicidal thoughts 0 0 0  PHQ-9 Score 2 1 4      GAD 7 : Generalized Anxiety Score 06/30/2021 04/04/2021 02/16/2021  Nervous, Anxious, on Edge 0 0 0  Control/stop worrying 0 0 0  Worry too much - different things 0 0 0  Trouble relaxing 0 0 0  Restless 0 0 0  Easily annoyed or irritable 0 0 0  Afraid - awful might happen 0 0 0  Total GAD 7 Score 0 0 0      Review of Systems:   Pertinent items are noted in HPI Denies abnormal vaginal discharge w/ itching/odor/irritation, headaches, visual changes, shortness of breath, chest pain, abdominal pain, severe nausea/vomiting, or problems with urination or bowel movements unless otherwise stated above. Pertinent History Reviewed:  Reviewed past medical,surgical, social, obstetrical and family history.  Reviewed problem list, medications and allergies. Physical Assessment:   Vitals:   09/05/21 1333 09/05/21 1340  BP: 140/78  125/80  Pulse: 77 91  Weight: 181 lb (82.1 kg)   Body mass index is 30.12 kg/m.        Physical Examination:   General appearance: Well appearing, and in no distress  Mental status: Alert, oriented to person, place, and time  Skin: Warm & dry  Cardiovascular: Normal heart rate noted  Respiratory: Normal respiratory effort, no distress  Abdomen: Soft, gravid, nontender  Pelvic:  SSE: cx visually long/closed, milky watery d/c in posterior fornix, nothing from cx w/ valsalva. Fern & nitrazine neg.           Extremities: Edema: Mild pitting, slight indentation  Fetal Status: Fetal Heart Rate (bpm): 125 Fundal Height: 35 cm Movement: Present Presentation: Vertex  Informal TA u/s: vtx, subjectively normal fluid  Chaperone: 13/01/22   Results for orders placed or performed in visit on 09/05/21 (from the past 24 hour(s))  POC Urinalysis Dipstick OB   Collection Time: 09/05/21  1:39 PM  Result Value Ref Range   Color, UA     Clarity, UA     Glucose, UA Negative Negative   Bilirubin, UA     Ketones, UA neg    Spec Grav, UA     Blood, UA neg    pH, UA     POC,PROTEIN,UA Negative Negative, Trace, Small (1+), Moderate (2+), Large (3+), 4+   Urobilinogen, UA     Nitrite, UA neg    Leukocytes,  UA Negative Negative   Appearance     Odor      Assessment & Plan:  1) Low-risk pregnancy G1P0 at [redacted]w[redacted]d with an Estimated Date of Delivery: 10/05/21   2) Milky watery vaginal d/c, does not appear to be ROM, fern and nitrazine neg, subjectively normal fluid on informal TA u/s. CV swab sent  3) Initial bp elevated> normal repeat, no proteinuria. Home bp's normal   Meds: No orders of the defined types were placed in this encounter.  Labs/procedures today: spec exam and fern, nitrazine  Plan:  Continue routine obstetrical care  Next visit: prefers in person    Reviewed: Preterm labor symptoms and general obstetric precautions including but not limited to vaginal bleeding, contractions,  leaking of fluid and fetal movement were reviewed in detail with the patient.  All questions were answered. Does have home bp cuff. Office bp cuff given: not applicable. Check bp daily, let us know if consistently >140 and/or >90.  Follow-up: Return for As scheduled.  Future Appointments  Date Time Provider [place]  09/08/2021 11:30 AM Myrtis Ser, CNM [facility]  09/14/2021  3:30 PM Roma Schanz, CNM [facility]  09/22/2021 10:30 AM Janyth Pupa, DO [facility]  09/27/2021  4:10 PM Roma Schanz, CNM [facility]    Orders Placed This Encounter  Procedures   POC Urinalysis Dipstick OB   Roma Schanz CNM, [facility] 09/05/2021 2:14 PM

## 2021-09-05 NOTE — Patient Instructions (Signed)
[place], thank you for choosing our office today! We appreciate the opportunity to meet your healthcare needs. You may receive a short survey by mail, e-mail, or through [employer]. If you are happy with your care we would appreciate if you could take just a few minutes to complete the survey questions. We read all of your comments and take your feedback very seriously. Thank you again for choosing our office.  Center for [employer] Team at [facility]  [facility] at [facility] ([address] [place], [place] [address]) Entrance C, located off of [address] Free 24/7 valet parking   CLASSES: Go to [email or web address] to register for classes (childbirth, breastfeeding, waterbirth, infant CPR, daddy bootcamp, etc.)  Call the office [phone]) or go to [facility] if: You begin to have strong, frequent contractions Your water breaks.  Sometimes it is a big gush of fluid, sometimes it is just a trickle that keeps getting your panties wet or running down your legs You have vaginal bleeding.  It is normal to have a small amount of spotting if your cervix was checked.  You don't feel your baby moving like normal.  If you don't, get you something to eat and drink and lay down and focus on feeling your baby move.   If your baby is still not moving like normal, you should call the office or go to [facility].  Call the office [phone]) or go to [facility] hospital for these signs of pre-eclampsia: Severe headache that does not go away with Tylenol Visual changes- seeing spots, double, blurred vision Pain under your right breast or upper abdomen that does not go away with Tums or heartburn medicine Nausea and/or vomiting Severe swelling in your hands, feet, and face   Tdap Vaccine It is recommended that you get the Tdap vaccine during the third trimester of EACH pregnancy to help protect your baby from getting pertussis (whooping cough) 27-36 weeks is the BEST time to do  this so that you can pass the protection on to your baby. During pregnancy is better than after pregnancy, but if you are unable to get it during pregnancy it will be offered at the hospital.  You can get this vaccine with [place], at the health department, your family doctor, or some local pharmacies Everyone who will be around your baby should also be up-to-date on their vaccines before the baby comes. Adults (who are not pregnant) only need 1 dose of Tdap during adulthood.   [facility] Pediatricians/Family Doctors [facility]): [address] Colette Ribas, [phone]           [facility]: [address], [phone]                [facility] Medicine [facility]): [address], [phone] (call to ask if accepting patients) [facility]: [address], [place], [phone]    [facility] Pediatricians/Family Doctors [facility]): [phone] [address], [phone] [facility]: [address] [place], [phone] [facility]: [address], [phone]  [facility] Doctors  [facility]): [phone] [facility]: [address], [phone]   [facility] Doctors [facility]: [address] [phone]  [facility] Family Doctors  [facility]: [phone], [phone]  Home Blood Pressure Monitoring for Patients   Your provider has recommended that you check your  blood pressure (BP) at least once a week at home. If you do not have a blood pressure cuff at home, one will be provided for you. Contact your provider if you have not received your monitor within 1 week.   Helpful Tips for Accurate Home Blood Pressure Checks  Don't smoke, exercise, or drink caffeine 30 minutes before checking your BP Use the restroom before checking your BP (a full bladder can raise your  pressure) Relax in a comfortable upright chair Feet on the ground Left arm resting comfortably on a flat surface at the level of your heart Legs uncrossed Back supported Sit quietly and don't talk Place the cuff on your bare arm Adjust snuggly, so that only two fingertips can fit between your skin and the top of the cuff Check 2 readings separated by at least one minute Keep a log of your BP readings For a visual, please reference this diagram: http://ccnc.care/bpdiagram  Provider Name: [facility]     Phone: [phone]  Zone 1: ALL CLEAR  Continue to monitor your symptoms:  BP reading is less than 140 (top number) or less than 90 (bottom number)  No right upper stomach pain No headaches or seeing spots No feeling nauseated or throwing up No swelling in face and hands  Zone 2: CAUTION Call your doctor's office for any of the following:  BP reading is greater than 140 (top number) or greater than 90 (bottom number)  Stomach pain under your ribs in the middle or right side Headaches or seeing spots Feeling nauseated or throwing up Swelling in face and hands  Zone 3: EMERGENCY  Seek immediate medical care if you have any of the following:  BP reading is greater than160 (top number) or greater than 110 (bottom number) Severe headaches not improving with Tylenol Serious difficulty catching your breath Any worsening symptoms from Zone 2  Preterm Labor and Birth Information  The normal length of a pregnancy is 39-41 weeks. Preterm labor is when labor starts before 37 completed weeks of pregnancy. What are the risk factors for preterm labor? Preterm labor is more likely to occur in women who: Have certain infections during pregnancy such as a bladder infection, sexually transmitted infection, or infection inside the uterus (chorioamnionitis). Have a shorter-than-normal cervix. Have gone into preterm labor before. Have had surgery on their cervix. Are younger than age 17  or older than age 35. Are African American. Are pregnant with twins or multiple babies (multiple gestation). Take street drugs or smoke while pregnant. Do not gain enough weight while pregnant. Became pregnant shortly after having been pregnant. What are the symptoms of preterm labor? Symptoms of preterm labor include: Cramps similar to those that can happen during a menstrual period. The cramps may happen with diarrhea. Pain in the abdomen or lower back. Regular uterine contractions that may feel like tightening of the abdomen. A feeling of increased pressure in the pelvis. Increased watery or bloody mucus discharge from the vagina. Water breaking (ruptured amniotic sac). Why is it important to recognize signs of preterm labor? It is important to recognize signs of preterm labor because babies who are born prematurely may not be fully developed. This can put them at an increased risk for: Long-term (chronic) heart and lung problems. Difficulty immediately after birth with regulating body systems, including blood sugar, body temperature, heart rate, and breathing rate. Bleeding in the brain. Cerebral palsy. Learning difficulties. Death. These risks are highest for babies who are born before 34 weeks   of pregnancy. How is preterm labor treated? Treatment depends on the length of your pregnancy, your condition, and the health of your baby. It may involve: Having a stitch (suture) placed in your cervix to prevent your cervix from opening too early (cerclage). Taking or being given medicines, such as: Hormone medicines. These may be given early in pregnancy to help support the pregnancy. Medicine to stop contractions. Medicines to help mature the baby's lungs. These may be prescribed if the risk of delivery is high. Medicines to prevent your baby from developing cerebral palsy. If the labor happens before 34 weeks of pregnancy, you may need to stay in the hospital. What should I do if I  think I am in preterm labor? If you think that you are going into preterm labor, call your health care provider right away. How can I prevent preterm labor in future pregnancies? To increase your chance of having a full-term pregnancy: Do not use any tobacco products, such as cigarettes, chewing tobacco, and e-cigarettes. If you need help quitting, ask your health care provider. Do not use street drugs or medicines that have not been prescribed to you during your pregnancy. Talk with your health care provider before taking any herbal supplements, even if you have been taking them regularly. Make sure you gain a healthy amount of weight during your pregnancy. Watch for infection. If you think that you might have an infection, get it checked right away. Make sure to tell your health care provider if you have gone into preterm labor before. This information is not intended to replace advice given to you by your health care provider. Make sure you discuss any questions you have with your health care provider. Document Revised: 02/13/2019 Document Reviewed: 03/14/2016 Elsevier Patient Education  2020 Elsevier Inc.   

## 2021-09-07 LAB — CERVICOVAGINAL ANCILLARY ONLY
Bacterial Vaginitis (gardnerella): NEGATIVE
Candida Glabrata: NEGATIVE
Candida Vaginitis: NEGATIVE
Chlamydia: NEGATIVE
Comment: NEGATIVE
Comment: NEGATIVE
Comment: NEGATIVE
Comment: NEGATIVE
Comment: NEGATIVE
Comment: NORMAL
Neisseria Gonorrhea: NEGATIVE
Trichomonas: NEGATIVE

## 2021-09-08 ENCOUNTER — Other Ambulatory Visit: Payer: Self-pay

## 2021-09-08 ENCOUNTER — Ambulatory Visit (INDEPENDENT_AMBULATORY_CARE_PROVIDER_SITE_OTHER): Payer: BC Managed Care – PPO | Admitting: Advanced Practice Midwife

## 2021-09-08 VITALS — BP 125/81 | HR 82 | Wt 182.0 lb

## 2021-09-08 DIAGNOSIS — Z3403 Encounter for supervision of normal first pregnancy, third trimester: Secondary | ICD-10-CM

## 2021-09-08 DIAGNOSIS — Z3A36 36 weeks gestation of pregnancy: Secondary | ICD-10-CM | POA: Diagnosis not present

## 2021-09-08 NOTE — Progress Notes (Signed)
   LOW-RISK PREGNANCY VISIT Patient name: Melinda Wright MRN [id number]  Date of birth: 01-25-1996 Chief Complaint:   Routine Prenatal Visit  History of Present Illness:   Melinda Wright is a 25 y.o. G1P0 female at [redacted]w[redacted]d with an Estimated Date of Delivery: 10/05/21 being seen today for ongoing management of a low-risk pregnancy.  Today she reports  discomforts of late preg . Contractions: Irritability. Vag. Bleeding: None.  Movement: Present. denies leaking of fluid. Review of Systems:   Pertinent items are noted in HPI Denies abnormal vaginal discharge w/ itching/odor/irritation, headaches, visual changes, shortness of breath, chest pain, abdominal pain, severe nausea/vomiting, or problems with urination or bowel movements unless otherwise stated above. Pertinent History Reviewed:  Reviewed past medical,surgical, social, obstetrical and family history.  Reviewed problem list, medications and allergies. Physical Assessment:   Vitals:   09/08/21 1124  BP: 125/81  Pulse: 82  Weight: 182 lb (82.6 kg)  Body mass index is 30.29 kg/m.        Physical Examination:   General appearance: Well appearing, and in no distress  Mental status: Alert, oriented to person, place, and time  Skin: Warm & dry  Cardiovascular: Normal heart rate noted  Respiratory: Normal respiratory effort, no distress  Abdomen: Soft, gravid, nontender  Pelvic: Cervical exam deferred         Extremities: Edema: Trace  Fetal Status: Fetal Heart Rate (bpm): 128 Fundal Height: 35 cm Movement: Present Presentation: Vertex  No results found for this or any previous visit (from the past 24 hour(s)).  Assessment & Plan:  1) Low-risk pregnancy G1P0 at [redacted]w[redacted]d with an Estimated Date of Delivery: 10/05/21     Meds: No orders of the defined types were placed in this encounter.  Labs/procedures today: GBS (neg GC/chlam 09/05/21)  Plan:  Continue routine obstetrical care   Reviewed: Term labor symptoms and general  obstetric precautions including but not limited to vaginal bleeding, contractions, leaking of fluid and fetal movement were reviewed in detail with the patient.  All questions were answered. Has home bp cuff. Check bp weekly, let us know if >140/90.   Follow-up: Return in about 1 week (around 09/15/2021) for LROB, in person.  Orders Placed This Encounter  Procedures   Culture, beta strep (group b only)   Arabella Merles CNM 09/08/2021 12:01 PM

## 2021-09-12 LAB — CULTURE, BETA STREP (GROUP B ONLY): Strep Gp B Culture: NEGATIVE

## 2021-09-13 ENCOUNTER — Encounter: Payer: Self-pay | Admitting: Women's Health

## 2021-09-13 ENCOUNTER — Other Ambulatory Visit: Payer: Self-pay

## 2021-09-13 ENCOUNTER — Ambulatory Visit (INDEPENDENT_AMBULATORY_CARE_PROVIDER_SITE_OTHER): Payer: BC Managed Care – PPO | Admitting: Women's Health

## 2021-09-13 VITALS — BP 123/75 | HR 79 | Wt 182.4 lb

## 2021-09-13 DIAGNOSIS — Z3403 Encounter for supervision of normal first pregnancy, third trimester: Secondary | ICD-10-CM

## 2021-09-13 NOTE — Progress Notes (Signed)
LOW-RISK PREGNANCY VISIT Patient name: Melinda Wright MRN [id number]  Date of birth: 04-11-1996 Chief Complaint:   Routine Prenatal Visit  History of Present Illness:   Melinda Wright is a 25 y.o. G1P0 female at [redacted]w[redacted]d with an Estimated Date of Delivery: 10/05/21 being seen today for ongoing management of a low-risk pregnancy.   Today she reports no complaints. Contractions: Irritability. Vag. Bleeding: None.  Movement: Present. denies leaking of fluid.  Depression screen [facility] 2/9 06/30/2021 04/04/2021 02/16/2021  Decreased Interest 0 0 0  Down, Depressed, Hopeless 0 0 0  PHQ - 2 Score 0 0 0  Altered sleeping 1 1 2   Tired, decreased energy 1 0 2  Change in appetite 0 0 0  Feeling bad or failure about yourself  0 0 0  Trouble concentrating 0 0 0  Moving slowly or fidgety/restless 0 0 0  Suicidal thoughts 0 0 0  PHQ-9 Score 2 1 4      GAD 7 : Generalized Anxiety Score 06/30/2021 04/04/2021 02/16/2021  Nervous, Anxious, on Edge 0 0 0  Control/stop worrying 0 0 0  Worry too much - different things 0 0 0  Trouble relaxing 0 0 0  Restless 0 0 0  Easily annoyed or irritable 0 0 0  Afraid - awful might happen 0 0 0  Total GAD 7 Score 0 0 0      Review of Systems:   Pertinent items are noted in HPI Denies abnormal vaginal discharge w/ itching/odor/irritation, headaches, visual changes, shortness of breath, chest pain, abdominal pain, severe nausea/vomiting, or problems with urination or bowel movements unless otherwise stated above. Pertinent History Reviewed:  Reviewed past medical,surgical, social, obstetrical and family history.  Reviewed problem list, medications and allergies. Physical Assessment:   Vitals:   09/13/21 1601  BP: 123/75  Pulse: 79  Weight: 182 lb 6.4 oz (82.7 kg)  Body mass index is 30.35 kg/m.        Physical Examination:   General appearance: Well appearing, and in no distress  Mental status: Alert, oriented to person, place, and time  Skin: Warm &  dry  Cardiovascular: Normal heart rate noted  Respiratory: Normal respiratory effort, no distress  Abdomen: Soft, gravid, nontender  Pelvic: Cervical exam performed  Dilation: 1.5 Effacement (%): Thick Station: Ballotable  Extremities: Edema: Mild pitting, slight indentation  Fetal Status: Fetal Heart Rate (bpm): 135 Fundal Height: 36 cm Movement: Present Presentation: Vertex  Chaperone: 02/18/2021   No results found for this or any previous visit (from the past 24 hour(s)).  Assessment & Plan:  1) Low-risk pregnancy G1P0 at [redacted]w[redacted]d with an Estimated Date of Delivery: 10/05/21    Meds: No orders of the defined types were placed in this encounter.  Labs/procedures today: SVE  Plan:  Continue routine obstetrical care  Next visit: prefers in person    Reviewed: Preterm labor symptoms and general obstetric precautions including but not limited to vaginal bleeding, contractions, leaking of fluid and fetal movement were reviewed in detail with the patient.  All questions were answered. Does have home bp cuff. Office bp cuff given: not applicable. Check bp weekly, let [redacted]w[redacted]d know if consistently >140 and/or >90.  Follow-up: Return for weekly, CNM, in person.  Future Appointments  Date Time Provider Department Center  09/22/2021 10:30 AM [place], DO [facility]  09/27/2021  4:10 PM Myna Hidalgo, CNM [facility]    No orders of the defined types were placed in this encounter.  Cheral Marker CNM, [facility] 09/13/2021 4:25 PM

## 2021-09-13 NOTE — Patient Instructions (Signed)
[place], thank you for choosing our office today! We appreciate the opportunity to meet your healthcare needs. You may receive a short survey by mail, e-mail, or through [employer]. If you are happy with your care we would appreciate if you could take just a few minutes to complete the survey questions. We read all of your comments and take your feedback very seriously. Thank you again for choosing our office.  Center for [employer] Team at [facility]  [facility] at [facility] ([address] [place], [place] [address]) Entrance C, located off of [address] Free 24/7 valet parking   CLASSES: Go to [email or web address] to register for classes (childbirth, breastfeeding, waterbirth, infant CPR, daddy bootcamp, etc.)  Call the office [phone]) or go to [facility] if: You begin to have strong, frequent contractions Your water breaks.  Sometimes it is a big gush of fluid, sometimes it is just a trickle that keeps getting your panties wet or running down your legs You have vaginal bleeding.  It is normal to have a small amount of spotting if your cervix was checked.  You don't feel your baby moving like normal.  If you don't, get you something to eat and drink and lay down and focus on feeling your baby move.   If your baby is still not moving like normal, you should call the office or go to [facility]  Call the office [phone]) or go to [facility] hospital for these signs of pre-eclampsia: Severe headache that does not go away with Tylenol Visual changes- seeing spots, double, blurred vision Pain under your right breast or upper abdomen that does not go away with Tums or heartburn medicine Nausea and/or vomiting Severe swelling in your hands, feet, and face   [facility] Pediatricians/Family Doctors [facility]): [address] Colette Ribas, [phone]           [facility]: [address], [phone]                 [facility]): [address], [phone] (call to ask if accepting patients) [facility] Department: [address], [place], [phone]    [facility] Pediatricians/Family Doctors [facility]): [address], [phone] [facility]: [address] [place], [phone] [facility]: [address], [phone]  [facility] Doctors  [facility]): [phone] [facility]: [address], [phone]   [facility] Doctors [facility]: [address], [phone]  [facility] Doctors  [facility]: [phone], [phone]  Home Blood Pressure Monitoring for Patients   Your provider has recommended that you check your blood pressure (BP) at least once a week at home. If you do not have a blood pressure cuff at home, one will be provided for you. Contact your provider if you have not received your monitor within 1 week.   Helpful Tips for Accurate Home Blood Pressure Checks  Don't smoke, exercise, or drink caffeine 30 minutes before checking your BP Use the restroom before checking your BP (a full bladder can raise your pressure) Relax in a comfortable upright chair Feet on the ground Left arm resting comfortably on a flat surface at the level of your heart Legs uncrossed Back supported Sit quietly and don't talk Place the cuff on your bare arm Adjust snuggly, so that only two fingertips  can fit between your skin and the top of the cuff Check 2 readings separated by at least one minute Keep a log of your BP readings For a visual, please reference this diagram: http://ccnc.care/bpdiagram  Provider Name: [facility]     Phone: [phone]  Zone 1: ALL CLEAR  Continue to monitor your symptoms:  BP reading is less than 140 (top number) or less than 90 (bottom number)  No right  upper stomach pain No headaches or seeing spots No feeling nauseated or throwing up No swelling in face and hands  Zone 2: CAUTION Call your doctor's office for any of the following:  BP reading is greater than 140 (top number) or greater than 90 (bottom number)  Stomach pain under your ribs in the middle or right side Headaches or seeing spots Feeling nauseated or throwing up Swelling in face and hands  Zone 3: EMERGENCY  Seek immediate medical care if you have any of the following:  BP reading is greater than160 (top number) or greater than 110 (bottom number) Severe headaches not improving with Tylenol Serious difficulty catching your breath Any worsening symptoms from Zone 2   Braxton Hicks Contractions Contractions of the uterus can occur throughout pregnancy, but they are not always a sign that you are in labor. You may have practice contractions called Braxton Hicks contractions. These false labor contractions are sometimes confused with true labor. What are [place] contractions? Braxton Hicks contractions are tightening movements that occur in the muscles of the uterus before labor. Unlike true labor contractions, these contractions do not result in opening (dilation) and thinning of the cervix. Toward the end of pregnancy (32-34 weeks), Braxton Hicks contractions can happen more often and may become stronger. These contractions are sometimes difficult to tell apart from true labor because they can be very uncomfortable. You should not feel embarrassed if you go to the hospital with false labor. Sometimes, the only way to tell if you are in true labor is for your health care provider to look for changes in the cervix. The health care provider will do a physical exam and may monitor your contractions. If you are not in true labor, the exam should show that your cervix is not dilating and your water has not broken. If there are no other health problems associated with your  pregnancy, it is completely safe for you to be sent home with false labor. You may continue to have Braxton Hicks contractions until you go into true labor. How to tell the difference between true labor and false labor True labor Contractions last 30-70 seconds. Contractions become very regular. Discomfort is usually felt in the top of the uterus, and it spreads to the lower abdomen and low back. Contractions do not go away with walking. Contractions usually become more intense and increase in frequency. The cervix dilates and gets thinner. False labor Contractions are usually shorter and not as strong as true labor contractions. Contractions are usually irregular. Contractions are often felt in the front of the lower abdomen and in the groin. Contractions may go away when you walk around or change positions while lying down. Contractions get weaker and are shorter-lasting as time goes on. The cervix usually does not dilate or become thin. Follow these instructions at home:  Take over-the-counter and prescription medicines only as told by your health care provider. Keep up with your usual exercises and follow other instructions from your health care provider. Eat and drink lightly if you think  you are going into labor. If Braxton Hicks contractions are making you uncomfortable: Change your position from lying down or resting to walking, or change from walking to resting. Sit and rest in a tub of warm water. Drink enough fluid to keep your urine pale yellow. Dehydration may cause these contractions. Do slow and deep breathing several times an hour. Keep all follow-up prenatal visits as told by your health care provider. This is important. Contact a health care provider if: You have a fever. You have continuous pain in your abdomen. Get help right away if: Your contractions become stronger, more regular, and closer together. You have fluid leaking or gushing from your vagina. You pass  blood-tinged mucus (bloody show). You have bleeding from your vagina. You have low back pain that you never had before. You feel your baby's head pushing down and causing pelvic pressure. Your baby is not moving inside you as much as it used to. Summary Contractions that occur before labor are called Braxton Hicks contractions, false labor, or practice contractions. Braxton Hicks contractions are usually shorter, weaker, farther apart, and less regular than true labor contractions. True labor contractions usually become progressively stronger and regular, and they become more frequent. Manage discomfort from Braxton Hicks contractions by changing position, resting in a warm bath, drinking plenty of water, or practicing deep breathing. This information is not intended to replace advice given to you by your health care provider. Make sure you discuss any questions you have with your health care provider. Document Revised: 10/04/2017 Document Reviewed: 03/07/2017 Elsevier Patient Education  2020 Elsevier Inc.   

## 2021-09-14 ENCOUNTER — Encounter: Payer: BC Managed Care – PPO | Admitting: Women's Health

## 2021-09-22 ENCOUNTER — Ambulatory Visit (INDEPENDENT_AMBULATORY_CARE_PROVIDER_SITE_OTHER): Payer: BC Managed Care – PPO | Admitting: Obstetrics & Gynecology

## 2021-09-22 ENCOUNTER — Other Ambulatory Visit: Payer: Self-pay

## 2021-09-22 ENCOUNTER — Encounter: Payer: Self-pay | Admitting: Obstetrics & Gynecology

## 2021-09-22 VITALS — BP 127/78 | HR 75 | Wt 185.8 lb

## 2021-09-22 DIAGNOSIS — Z3403 Encounter for supervision of normal first pregnancy, third trimester: Secondary | ICD-10-CM

## 2021-09-22 DIAGNOSIS — Z3A38 38 weeks gestation of pregnancy: Secondary | ICD-10-CM

## 2021-09-22 NOTE — Progress Notes (Signed)
   LOW-RISK PREGNANCY VISIT Patient name: Melinda Wright MRN [id number]  Date of birth: 1995/12/01 Chief Complaint:   Routine Prenatal Visit  History of Present Illness:   Melinda Wright is a 25 y.o. G1P0 female at [redacted]w[redacted]d with an Estimated Date of Delivery: 10/05/21 being seen today for ongoing management of a low-risk pregnancy.   Depression screen [facility] 2/9 06/30/2021 04/04/2021 02/16/2021  Decreased Interest 0 0 0  Down, Depressed, Hopeless 0 0 0  PHQ - 2 Score 0 0 0  Altered sleeping 1 1 2   Tired, decreased energy 1 0 2  Change in appetite 0 0 0  Feeling bad or failure about yourself  0 0 0  Trouble concentrating 0 0 0  Moving slowly or fidgety/restless 0 0 0  Suicidal thoughts 0 0 0  PHQ-9 Score 2 1 4     Today she reports occasional contractions. Contractions: Irregular. Vag. Bleeding: None.  Movement: Present. denies leaking of fluid. Some LE swelling.  No other complaints  Review of Systems:   Pertinent items are noted in HPI Denies abnormal vaginal discharge w/ itching/odor/irritation, headaches, visual changes, shortness of breath, chest pain, abdominal pain, severe nausea/vomiting, or problems with urination or bowel movements unless otherwise stated above. Pertinent History Reviewed:  Reviewed past medical,surgical, social, obstetrical and family history.  Reviewed problem list, medications and allergies.  Physical Assessment:   Vitals:   09/22/21 1043  BP: 127/78  Pulse: 75  Weight: 185 lb 12.8 oz (84.3 kg)  Body mass index is 30.92 kg/m.        Physical Examination:   General appearance: Well appearing, and in no distress  Mental status: Alert, oriented to person, place, and time  Skin: Warm & dry  Respiratory: Normal respiratory effort, no distress  Abdomen: Soft, gravid, nontender  Pelvic: Cervical exam performed  Dilation: 1.5 Effacement (%): Thick Station: Ballotable  Extremities: Edema: Mild pitting, slight indentation  Psych:  mood and affect  appropriate  Fetal Status: Fetal Heart Rate (bpm): 135 Fundal Height: 37 cm Movement: Present Presentation: Vertex  Chaperone:  declined     No results found for this or any previous visit (from the past 24 hour(s)).   Assessment & Plan:  1) Low-risk pregnancy G1P0 at [redacted]w[redacted]d with an Estimated Date of Delivery: 10/05/21     Meds: No orders of the defined types were placed in this encounter.  Labs/procedures today: none  Plan:  Continue routine obstetrical care  Next visit: prefers in person    Reviewed: Term labor symptoms and general obstetric precautions including but not limited to vaginal bleeding, contractions, leaking of fluid and fetal movement were reviewed in detail with the patient.  All questions were answered. Pt has home bp cuff. Check bp weekly, let [redacted]w[redacted]d know if >140/90.   Follow-up: Return in about 1 week (around 09/29/2021) for LROB visit.  No orders of the defined types were placed in this encounter.   [place], DO Attending Obstetrician & Gynecologist, [facility] for Melinda Wright, [facility]

## 2021-09-27 ENCOUNTER — Encounter: Payer: Self-pay | Admitting: Women's Health

## 2021-09-27 ENCOUNTER — Other Ambulatory Visit: Payer: Self-pay

## 2021-09-27 ENCOUNTER — Ambulatory Visit (INDEPENDENT_AMBULATORY_CARE_PROVIDER_SITE_OTHER): Payer: BC Managed Care – PPO | Admitting: Women's Health

## 2021-09-27 VITALS — BP 134/82 | HR 99 | Wt 184.0 lb

## 2021-09-27 DIAGNOSIS — Z3A38 38 weeks gestation of pregnancy: Secondary | ICD-10-CM

## 2021-09-27 DIAGNOSIS — Z3403 Encounter for supervision of normal first pregnancy, third trimester: Secondary | ICD-10-CM

## 2021-09-27 NOTE — Patient Instructions (Signed)
[place], thank you for choosing our office today! We appreciate the opportunity to meet your healthcare needs. You may receive a short survey by mail, e-mail, or through [employer]. If you are happy with your care we would appreciate if you could take just a few minutes to complete the survey questions. We read all of your comments and take your feedback very seriously. Thank you again for choosing our office.  Center for [employer] Team at [facility]  [facility] at [facility] ([address] [place], [place] [address]) Entrance C, located off of [address] Free 24/7 valet parking   CLASSES: Go to [email or web address] to register for classes (childbirth, breastfeeding, waterbirth, infant CPR, daddy bootcamp, etc.)  Call the office [phone]) or go to [facility] if: You begin to have strong, frequent contractions Your water breaks.  Sometimes it is a big gush of fluid, sometimes it is just a trickle that keeps getting your panties wet or running down your legs You have vaginal bleeding.  It is normal to have a small amount of spotting if your cervix was checked.  You don't feel your baby moving like normal.  If you don't, get you something to eat and drink and lay down and focus on feeling your baby move.   If your baby is still not moving like normal, you should call the office or go to [facility]  Call the office [phone]) or go to [facility] hospital for these signs of pre-eclampsia: Severe headache that does not go away with Tylenol Visual changes- seeing spots, double, blurred vision Pain under your right breast or upper abdomen that does not go away with Tums or heartburn medicine Nausea and/or vomiting Severe swelling in your hands, feet, and face   [facility] Pediatricians/Family Doctors [facility]): [address] Colette Ribas, [phone]           [facility]: [address], [phone]                 [facility]): [address], [phone] (call to ask if accepting patients) [facility] Department: [address], [place], [phone]    [facility] Pediatricians/Family Doctors [facility]): [address], [phone] [facility]: [address] [place], [phone] [facility]: [address], [phone]  [facility] Doctors  [facility]): [phone] [facility]: [address], [phone]   [facility] Doctors [facility]: [address], [phone]  [facility] Doctors  [facility]: [phone], [phone]  Home Blood Pressure Monitoring for Patients   Your provider has recommended that you check your blood pressure (BP) at least once a week at home. If you do not have a blood pressure cuff at home, one will be provided for you. Contact your provider if you have not received your monitor within 1 week.   Helpful Tips for Accurate Home Blood Pressure Checks  Don't smoke, exercise, or drink caffeine 30 minutes before checking your BP Use the restroom before checking your BP (a full bladder can raise your pressure) Relax in a comfortable upright chair Feet on the ground Left arm resting comfortably on a flat surface at the level of your heart Legs uncrossed Back supported Sit quietly and don't talk Place the cuff on your bare arm Adjust snuggly, so that only two fingertips  can fit between your skin and the top of the cuff Check 2 readings separated by at least one minute Keep a log of your BP readings For a visual, please reference this diagram: http://ccnc.care/bpdiagram  Provider Name: [facility]     Phone: [phone]  Zone 1: ALL CLEAR  Continue to monitor your symptoms:  BP reading is less than 140 (top number) or less than 90 (bottom number)  No right  upper stomach pain No headaches or seeing spots No feeling nauseated or throwing up No swelling in face and hands  Zone 2: CAUTION Call your doctor's office for any of the following:  BP reading is greater than 140 (top number) or greater than 90 (bottom number)  Stomach pain under your ribs in the middle or right side Headaches or seeing spots Feeling nauseated or throwing up Swelling in face and hands  Zone 3: EMERGENCY  Seek immediate medical care if you have any of the following:  BP reading is greater than160 (top number) or greater than 110 (bottom number) Severe headaches not improving with Tylenol Serious difficulty catching your breath Any worsening symptoms from Zone 2   Braxton Hicks Contractions Contractions of the uterus can occur throughout pregnancy, but they are not always a sign that you are in labor. You may have practice contractions called Braxton Hicks contractions. These false labor contractions are sometimes confused with true labor. What are [place] contractions? Braxton Hicks contractions are tightening movements that occur in the muscles of the uterus before labor. Unlike true labor contractions, these contractions do not result in opening (dilation) and thinning of the cervix. Toward the end of pregnancy (32-34 weeks), Braxton Hicks contractions can happen more often and may become stronger. These contractions are sometimes difficult to tell apart from true labor because they can be very uncomfortable. You should not feel embarrassed if you go to the hospital with false labor. Sometimes, the only way to tell if you are in true labor is for your health care provider to look for changes in the cervix. The health care provider will do a physical exam and may monitor your contractions. If you are not in true labor, the exam should show that your cervix is not dilating and your water has not broken. If there are no other health problems associated with your  pregnancy, it is completely safe for you to be sent home with false labor. You may continue to have Braxton Hicks contractions until you go into true labor. How to tell the difference between true labor and false labor True labor Contractions last 30-70 seconds. Contractions become very regular. Discomfort is usually felt in the top of the uterus, and it spreads to the lower abdomen and low back. Contractions do not go away with walking. Contractions usually become more intense and increase in frequency. The cervix dilates and gets thinner. False labor Contractions are usually shorter and not as strong as true labor contractions. Contractions are usually irregular. Contractions are often felt in the front of the lower abdomen and in the groin. Contractions may go away when you walk around or change positions while lying down. Contractions get weaker and are shorter-lasting as time goes on. The cervix usually does not dilate or become thin. Follow these instructions at home:  Take over-the-counter and prescription medicines only as told by your health care provider. Keep up with your usual exercises and follow other instructions from your health care provider. Eat and drink lightly if you think  you are going into labor. If Braxton Hicks contractions are making you uncomfortable: Change your position from lying down or resting to walking, or change from walking to resting. Sit and rest in a tub of warm water. Drink enough fluid to keep your urine pale yellow. Dehydration may cause these contractions. Do slow and deep breathing several times an hour. Keep all follow-up prenatal visits as told by your health care provider. This is important. Contact a health care provider if: You have a fever. You have continuous pain in your abdomen. Get help right away if: Your contractions become stronger, more regular, and closer together. You have fluid leaking or gushing from your vagina. You pass  blood-tinged mucus (bloody show). You have bleeding from your vagina. You have low back pain that you never had before. You feel your baby's head pushing down and causing pelvic pressure. Your baby is not moving inside you as much as it used to. Summary Contractions that occur before labor are called Braxton Hicks contractions, false labor, or practice contractions. Braxton Hicks contractions are usually shorter, weaker, farther apart, and less regular than true labor contractions. True labor contractions usually become progressively stronger and regular, and they become more frequent. Manage discomfort from [facility] contractions by changing position, resting in a warm bath, drinking plenty of water, or practicing deep breathing. This information is not intended to replace advice given to you by your health care provider. Make sure you discuss any questions you have with your health care provider. Document Revised: 10/04/2017 Document Reviewed: 03/07/2017 Elsevier Patient Education  [place].

## 2021-09-27 NOTE — Progress Notes (Signed)
LOW-RISK PREGNANCY VISIT Patient name: Melinda Wright MRN [id number]  Date of birth: 15-Jul-1996 Chief Complaint:   Routine Prenatal Visit  History of Present Illness:   Melinda Wright is a 25 y.o. G1P0 female at [redacted]w[redacted]d with an Estimated Date of Delivery: 10/05/21 being seen today for ongoing management of a low-risk pregnancy.   Today she reports no complaints. Denies ha, visual changes, ruq/epigastric pain, n/v.  Home bp's 130s/80s.  Contractions: Not present. Vag. Bleeding: None.  Movement: Present. denies leaking of fluid.  Depression screen [facility] 2/9 06/30/2021 04/04/2021 02/16/2021  Decreased Interest 0 0 0  Down, Depressed, Hopeless 0 0 0  PHQ - 2 Score 0 0 0  Altered sleeping 1 1 2   Tired, decreased energy 1 0 2  Change in appetite 0 0 0  Feeling bad or failure about yourself  0 0 0  Trouble concentrating 0 0 0  Moving slowly or fidgety/restless 0 0 0  Suicidal thoughts 0 0 0  PHQ-9 Score 2 1 4      GAD 7 : Generalized Anxiety Score 06/30/2021 04/04/2021 02/16/2021  Nervous, Anxious, on Edge 0 0 0  Control/stop worrying 0 0 0  Worry too much - different things 0 0 0  Trouble relaxing 0 0 0  Restless 0 0 0  Easily annoyed or irritable 0 0 0  Afraid - awful might happen 0 0 0  Total GAD 7 Score 0 0 0      Review of Systems:   Pertinent items are noted in HPI Denies abnormal vaginal discharge w/ itching/odor/irritation, headaches, visual changes, shortness of breath, chest pain, abdominal pain, severe nausea/vomiting, or problems with urination or bowel movements unless otherwise stated above. Pertinent History Reviewed:  Reviewed past medical,surgical, social, obstetrical and family history.  Reviewed problem list, medications and allergies. Physical Assessment:   Vitals:   09/27/21 1613  BP: 134/82  Pulse: 99  Weight: 184 lb (83.5 kg)  Body mass index is 30.62 kg/m.        Physical Examination:   General appearance: Well appearing, and in no distress  Mental  status: Alert, oriented to person, place, and time  Skin: Warm & dry  Cardiovascular: Normal heart rate noted  Respiratory: Normal respiratory effort, no distress  Abdomen: Soft, gravid, nontender  Pelvic: Cervical exam performed  Dilation: 2 Effacement (%): Thick Station: Ballotable  Extremities: Edema: Trace  Fetal Status: Fetal Heart Rate (bpm): 145 Fundal Height: 38 cm Movement: Present Presentation: Vertex  Chaperone: 02/18/2021   No results found for this or any previous visit (from the past 24 hour(s)).  Assessment & Plan:  1) Low-risk pregnancy G1P0 at [redacted]w[redacted]d with an Estimated Date of Delivery: 10/05/21   2) Borderline bp, asymptomatic, check bp at least once daily, let [redacted]w[redacted]d know if >140/90, reviewed pre-e s/s, reasons to seek care   Meds: No orders of the defined types were placed in this encounter.  Labs/procedures today: SVE  Plan:  Continue routine obstetrical care  Next visit: prefers in person    Reviewed: Term labor symptoms and general obstetric precautions including but not limited to vaginal bleeding, contractions, leaking of fluid and fetal movement were reviewed in detail with the patient.  All questions were answered. Does have home bp cuff. Office bp cuff given: not applicable. Check bp daily, let 14/1/22 know if consistently >140 and/or >90.  Follow-up: Return for 1wk LROB w/ CNM.  No future appointments.  No orders of the defined types were  placed in this encounter.  Cheral Marker CNM, [facility] 09/27/2021 4:38 PM

## 2021-09-28 ENCOUNTER — Encounter (HOSPITAL_COMMUNITY): Payer: Self-pay | Admitting: Obstetrics & Gynecology

## 2021-09-28 ENCOUNTER — Other Ambulatory Visit: Payer: Self-pay

## 2021-09-28 ENCOUNTER — Inpatient Hospital Stay (HOSPITAL_COMMUNITY)
Admission: AD | Admit: 2021-09-28 | Discharge: 2021-10-01 | DRG: 807 | Disposition: A | Discharge status: Home / Self Care | Payer: BC Managed Care – PPO | Attending: Obstetrics and Gynecology | Admitting: Obstetrics and Gynecology

## 2021-09-28 DIAGNOSIS — Z20822 Contact with and (suspected) exposure to covid-19: Secondary | ICD-10-CM | POA: Diagnosis present

## 2021-09-28 DIAGNOSIS — O139 Gestational [pregnancy-induced] hypertension without significant proteinuria, unspecified trimester: Secondary | ICD-10-CM | POA: Diagnosis present

## 2021-09-28 DIAGNOSIS — O134 Gestational [pregnancy-induced] hypertension without significant proteinuria, complicating childbirth: Secondary | ICD-10-CM | POA: Diagnosis not present

## 2021-09-28 DIAGNOSIS — O164 Unspecified maternal hypertension, complicating childbirth: Secondary | ICD-10-CM | POA: Diagnosis not present

## 2021-09-28 DIAGNOSIS — O1404 Mild to moderate pre-eclampsia, complicating childbirth: Principal | ICD-10-CM | POA: Diagnosis present

## 2021-09-28 DIAGNOSIS — Z3A39 39 weeks gestation of pregnancy: Secondary | ICD-10-CM

## 2021-09-28 DIAGNOSIS — Z3403 Encounter for supervision of normal first pregnancy, third trimester: Secondary | ICD-10-CM

## 2021-09-28 DIAGNOSIS — Z298 Encounter for other specified prophylactic measures: Secondary | ICD-10-CM | POA: Diagnosis not present

## 2021-09-28 DIAGNOSIS — Z880 Allergy status to penicillin: Secondary | ICD-10-CM

## 2021-09-28 DIAGNOSIS — O1490 Unspecified pre-eclampsia, unspecified trimester: Secondary | ICD-10-CM | POA: Diagnosis not present

## 2021-09-28 DIAGNOSIS — Z23 Encounter for immunization: Secondary | ICD-10-CM | POA: Diagnosis not present

## 2021-09-28 DIAGNOSIS — O133 Gestational [pregnancy-induced] hypertension without significant proteinuria, third trimester: Secondary | ICD-10-CM

## 2021-09-28 LAB — URINALYSIS, ROUTINE W REFLEX MICROSCOPIC
Bacteria, UA: NONE SEEN
Bilirubin Urine: NEGATIVE
Glucose, UA: NEGATIVE mg/dL
Hgb urine dipstick: NEGATIVE
Ketones, ur: NEGATIVE mg/dL
Leukocytes,Ua: NEGATIVE
Nitrite: NEGATIVE
Protein, ur: 100 mg/dL — AB
Specific Gravity, Urine: 1.023 (ref 1.005–1.030)
pH: 6 (ref 5.0–8.0)

## 2021-09-28 LAB — PROTEIN / CREATININE RATIO, URINE
Creatinine, Urine: 159.78 mg/dL
Protein Creatinine Ratio: 0.65 mg/mg{Cre} — ABNORMAL HIGH (ref 0.00–0.15)
Total Protein, Urine: 104 mg/dL

## 2021-09-28 LAB — COMPREHENSIVE METABOLIC PANEL
ALT: 14 U/L (ref 0–44)
AST: 26 U/L (ref 15–41)
Albumin: 3 g/dL — ABNORMAL LOW (ref 3.5–5.0)
Alkaline Phosphatase: 175 U/L — ABNORMAL HIGH (ref 38–126)
Anion gap: 9 (ref 5–15)
BUN: 12 mg/dL (ref 6–20)
CO2: 20 mmol/L — ABNORMAL LOW (ref 22–32)
Calcium: 8.6 mg/dL — ABNORMAL LOW (ref 8.9–10.3)
Chloride: 106 mmol/L (ref 98–111)
Creatinine, Ser: 0.63 mg/dL (ref 0.44–1.00)
GFR, Estimated: >60 mL/min (ref >60)
Glucose, Bld: 113 mg/dL — ABNORMAL HIGH (ref 70–99)
Potassium: 3.7 mmol/L (ref 3.5–5.1)
Sodium: 135 mmol/L (ref 135–145)
Total Bilirubin: 0.5 mg/dL (ref 0.3–1.2)
Total Protein: 6.2 g/dL — ABNORMAL LOW (ref 6.5–8.1)

## 2021-09-28 LAB — CBC
HCT: 33.5 % — ABNORMAL LOW (ref 36.0–46.0)
Hemoglobin: 10.8 g/dL — ABNORMAL LOW (ref 12.0–15.0)
MCH: 26.3 pg (ref 26.0–34.0)
MCHC: 32.2 g/dL (ref 30.0–36.0)
MCV: 81.7 fL (ref 80.0–100.0)
Platelets: 215 10*3/uL (ref 150–400)
RBC: 4.1 MIL/uL (ref 3.87–5.11)
RDW: 14.1 % (ref 11.5–15.5)
WBC: 7.7 10*3/uL (ref 4.0–10.5)
nRBC: 0 % (ref 0.0–0.2)

## 2021-09-28 LAB — TYPE AND SCREEN
ABO/RH(D): O POS
Antibody Screen: NEGATIVE

## 2021-09-28 LAB — RESP PANEL BY RT-PCR (FLU A&B, COVID) ARPGX2
Influenza A by PCR: NEGATIVE
Influenza B by PCR: NEGATIVE
SARS Coronavirus 2 by RT PCR: NEGATIVE

## 2021-09-28 MED ORDER — OXYTOCIN-SODIUM CHLORIDE 30-0.9 UT/500ML-% IV SOLN: 2.5000 [IU]/h | INTRAVENOUS | Status: DC

## 2021-09-28 MED ORDER — LIDOCAINE HCL (PF) 1 % IJ SOLN
30.0000 mL | INTRAMUSCULAR | Status: AC | PRN
  Administered 2021-09-29: 30 mL via SUBCUTANEOUS
  Filled 2021-09-28: qty 30

## 2021-09-28 MED ORDER — TERBUTALINE SULFATE 1 MG/ML IJ SOLN: 0.2500 mg | Freq: Once | INTRAMUSCULAR | Status: DC | PRN

## 2021-09-28 MED ORDER — OXYTOCIN-SODIUM CHLORIDE 30-0.9 UT/500ML-% IV SOLN
1.0000 m[IU]/min | INTRAVENOUS | Status: DC
  Filled 2021-09-28: qty 500

## 2021-09-28 MED ORDER — OXYTOCIN BOLUS FROM INFUSION
333.0000 mL | Freq: Once | INTRAVENOUS | Status: AC
  Administered 2021-09-29: 333 mL via INTRAVENOUS

## 2021-09-28 MED ORDER — LACTATED RINGERS IV SOLN: INTRAVENOUS | Status: DC

## 2021-09-28 MED ORDER — OXYCODONE-ACETAMINOPHEN 5-325 MG PO TABS: 1.0000 | ORAL_TABLET | ORAL | Status: DC | PRN

## 2021-09-28 MED ORDER — OXYCODONE-ACETAMINOPHEN 5-325 MG PO TABS: 2.0000 | ORAL_TABLET | ORAL | Status: DC | PRN

## 2021-09-28 MED ORDER — OXYTOCIN 10 UNIT/ML IJ SOLN: 10.0000 [IU] | Freq: Once | INTRAMUSCULAR | Status: DC | PRN

## 2021-09-28 MED ORDER — ACETAMINOPHEN 325 MG PO TABS: 650.0000 mg | ORAL_TABLET | ORAL | Status: DC | PRN

## 2021-09-28 MED ORDER — MISOPROSTOL 50MCG HALF TABLET
ORAL_TABLET | ORAL | Status: AC
  Administered 2021-09-28: 50 ug
  Filled 2021-09-28: qty 1

## 2021-09-28 MED ORDER — MISOPROSTOL 50MCG HALF TABLET
50.0000 ug | ORAL_TABLET | ORAL | Status: DC
  Administered 2021-09-28: 50 ug via BUCCAL
  Filled 2021-09-28: qty 1

## 2021-09-28 MED ORDER — LACTATED RINGERS IV SOLN
500.0000 mL | INTRAVENOUS | Status: DC | PRN
  Administered 2021-09-29: 500 mL via INTRAVENOUS

## 2021-09-28 MED ORDER — FENTANYL CITRATE (PF) 100 MCG/2ML IJ SOLN
100.0000 ug | INTRAMUSCULAR | Status: DC | PRN
  Administered 2021-09-29 (×2): 100 ug via INTRAVENOUS
  Filled 2021-09-28 (×2): qty 2

## 2021-09-28 MED ORDER — ZOLPIDEM TARTRATE 5 MG PO TABS: 5.0000 mg | ORAL_TABLET | Freq: Every evening | ORAL | Status: DC | PRN

## 2021-09-28 MED ORDER — SOD CITRATE-CITRIC ACID 500-334 MG/5ML PO SOLN: 30.0000 mL | ORAL | Status: DC | PRN

## 2021-09-28 MED ORDER — ONDANSETRON HCL 4 MG/2ML IJ SOLN
4.0000 mg | Freq: Four times a day (QID) | INTRAMUSCULAR | Status: DC | PRN
  Administered 2021-09-29 (×2): 4 mg via INTRAVENOUS
  Filled 2021-09-28 (×2): qty 2

## 2021-09-28 NOTE — MAU Note (Signed)
Presents stating she took her BP @ home and BP 144/98.  Denies H/A, epigastric pain, and visual disturbances.   Denies VB or LOF.  Reports +FM.

## 2021-09-28 NOTE — H&P (Addendum)
OBSTETRIC ADMISSION HISTORY AND PHYSICAL  Melinda Wright is a 25 y.o. female G1P0 with IUP at [redacted]w[redacted]d by LMP & 10 wk U/S presenting for gHTN.   Reports fetal movement. Denies vaginal bleeding.  She received her prenatal care at [facility].  Support person in labor: spouse  Ultrasounds Anatomy U/S:  1.  G1P0 Estimated Date of Delivery: 10/05/21 by  LMP, early ultrasound and confirmed by today's sonographic dating 2.  Normal fetal sonographic findings, specifically normal detailed anatomical evaluation,      no abnormalities noted 3.  Normal general sonographic findings   Recommend routine prenatal care based on this sonogram or as clinically indicated  Prenatal History/Complications: gHTN (newly dx'd 09/28/21)  OB BOX:  FAMILY TREE  RESULTS  Language English Pap 04/04/21: neg  Initiated care at 13wks GC/CT Initial: -/-           36wks:-/-  Dating by LMP c/w 10wk U/S    Support person  Genetics NT/IT:neg/neg   Panorama: low-risk female  BP cuff  Carrier Screen declined    [place]/Hgb Elec neg  Rhogam n/a    TDaP vaccine 08/04/21  Blood Type O/Positive/-- (05/27 1152)  Flu vaccine Declined  Antibody Negative (05/27 1152)  Covid vaccine  HBsAg Negative (05/27 1152)    RPR Non Reactive (05/27 1152)  Anatomy US Nl boy 'Brooks'  Rubella  2.98 (05/27 1152)  Feeding Plan breast HIV Non Reactive (05/27 1152)  Contraception declines Hep C neg  Circumcision Yes, at [facility]    Pediatrician [facility] A1C/GTT Early:      26-28wks:normal  Prenatal Classes discussed      GBS    neg [ ]  PCN allergy  BTL Consent n/a    VBAC Consent n/a PHQ9 & GAD7  [ ] New OB  [ ] 28wks   [ ] 36wks  Waterbirth [ ] Class [ ]  36wkCNM visit/consent     Past Medical History: Past Medical History:  Diagnosis Date   Foot fracture 2012   left metatarsal fracture   Medical history non-contributory     Past Surgical History: Past Surgical History:  Procedure Laterality Date   NO PAST SURGERIES      Obstetrical  History: OB History     Gravida  1   Para      Term      Preterm      AB      Living         SAB      IAB      Ectopic      Multiple      Live Births              Social History: Social History   Socioeconomic History   Marital status: Married    Spouse name: Not on file   Number of children: Not on file   Years of education: Not on file   Highest education level: Not on file  Occupational History   Not on file  Tobacco Use   Smoking status: Never   Smokeless tobacco: Never  Vaping Use   Vaping Use: Never used  Substance and Sexual Activity   Alcohol use: Not Currently   Drug use: Never   Sexual activity: Yes    Birth control/protection: None  Other Topics Concern   Not on file  Social History Narrative   Not on file   Social Determinants of Health   Financial Resource Strain: Low Risk    Difficulty of Paying  Living Expenses: Not hard at all  Food Insecurity: No Food Insecurity   Worried About Running Out of Food in the Last Year: Never true   Ran Out of Food in the Last Year: Never true  Transportation Needs: No Transportation Needs   Lack of Transportation (Medical): No   Lack of Transportation (Non-Medical): No  Physical Activity: Insufficiently Active   Days of Exercise per Week: 3 days   Minutes of Exercise per Session: 40 min  Stress: No Stress Concern Present   Feeling of Stress : Not at all  Social Connections: Moderately Integrated   Frequency of Communication with Friends and Family: More than three times a week   Frequency of Social Gatherings with Friends and Family: More than three times a week   Attends Religious Services: 1 to 4 times per year   Active Member of [employer] or Organizations: No   Attends Engineer, structural: Never   Marital Status: Married    Family History: Family History  Problem Relation Age of Onset   COPD Paternal Grandfather    Thyroid disease Maternal Grandmother     Allergies: No  Known Allergies  Medications Prior to Admission  Medication Sig Dispense Refill Last Dose   Pediatric Multiple Vitamins (FLINSTONES GUMMIES OMEGA-3 DHA PO) Take by mouth. Takes 2 daily        Review of Systems  All systems reviewed and negative except as stated in HPI  Patient Vitals for the past 24 hrs:  BP Temp Temp src Pulse Resp SpO2 Height Weight  09/28/21 1746 (!) 144/78 -- -- 82 -- -- -- --  09/28/21 1731 127/86 -- -- 93 -- -- -- --  09/28/21 1716 138/85 -- -- 92 -- -- -- --  09/28/21 1701 (!) 135/96 -- -- (!) 104 -- -- -- --  09/28/21 1649 137/90 -- -- (!) 107 -- -- -- --  09/28/21 1632 (!) 143/89 98 F (36.7 C) Oral (!) 111 18 98 % 5\' 4"  (1.626 m) 84.1 kg    General appearance: alert, cooperative, and no distress Lungs: no respiratory distress Heart: regular rate  Abdomen: soft, non-tender; gravid b Pelvic: adequate Extremities: Homans sign is negative, no sign of DVT Presentation: cephalic Fetal monitoring: 145 bpm / moderate variability / accels present / decels absent Uterine activity: UI noted Dilation: 2 Effacement (%): 60 Station: -3 Exam by:: [email or web address], CNM  Prenatal labs: ABO, Rh: --/--/PENDING (11/24 1659) Antibody: PENDING (11/24 1659) Rubella: 2.98 (05/27 1152) IMMUNE RPR: Non Reactive (08/26 0836)  HBsAg: Negative (05/27 1152)  HIV: Non Reactive (08/26 0836)  GBS: Negative/-- (11/04 1350)  Glucola: Normal Genetic screening: Normal  Prenatal Transfer Tool  Maternal Diabetes: No Genetic Screening: Normal Maternal Ultrasounds/Referrals: Normal Fetal Ultrasounds or other Referrals:  None Maternal Substance Abuse:  No Significant Maternal Medications:  None Significant Maternal Lab Results: Group B Strep negative  Results for orders placed or performed during the hospital encounter of 09/28/21 (from the past 24 hour(s))  Protein / creatinine ratio, urine   Collection Time: 09/28/21  4:39 PM  Result Value Ref Range   Creatinine, Urine 159.78  mg/dL   Total Protein, Urine 104 mg/dL   Protein Creatinine Ratio 0.65 (H) 0.00 - 0.15 mg/mg[Cre]  Urinalysis, Routine w reflex microscopic Urine, Clean Catch   Collection Time: 09/28/21  4:39 PM  Result Value Ref Range   Color, Urine YELLOW YELLOW   APPearance CLEAR CLEAR   Specific Gravity, Urine 1.023 1.005 - 1.030  pH 6.0 5.0 - 8.0   Glucose, UA NEGATIVE NEGATIVE mg/dL   Hgb urine dipstick NEGATIVE NEGATIVE   Bilirubin Urine NEGATIVE NEGATIVE   Ketones, ur NEGATIVE NEGATIVE mg/dL   Protein, ur 076 (A) NEGATIVE mg/dL   Nitrite NEGATIVE NEGATIVE   Leukocytes,Ua NEGATIVE NEGATIVE   RBC / HPF 0-5 0 - 5 RBC/hpf   WBC, UA 0-5 0 - 5 WBC/hpf   Bacteria, UA NONE SEEN NONE SEEN   Squamous Epithelial / LPF 0-5 0 - 5   Mucus PRESENT   Type and screen [facility]   Collection Time: 09/28/21  4:59 PM  Result Value Ref Range   ABO/RH(D) PENDING    Antibody Screen PENDING    Sample Expiration      10/01/2021,2359 Performed at [facility], [address], [place], [place] [address]     Patient Active Problem List   Diagnosis Date Noted   Supervision of normal first pregnancy 03/31/2021    Assessment/Plan:  Melinda Wright is a 25 y.o. G1P0 at [redacted]w[redacted]d here for IOL for gHTN.  Labor: IOL>Cooks catheter inserted and inflated w/80cc H20.  Buccal cytotec until balloon falls out, then Pitocin -- pain control: per request  Fetal Wellbeing: EFW 7.5 lbs by Leopold's. Cephalic by SVE.  -- GBS (Neg) -- continuous fetal monitoring - category 1   Postpartum Planning -- breast -- Tdap done 08/04/2021   Raelyn Mora, CNM  09/28/2021, 5:24 PM

## 2021-09-29 ENCOUNTER — Inpatient Hospital Stay (HOSPITAL_COMMUNITY): Payer: BC Managed Care – PPO | Admitting: Anesthesiology

## 2021-09-29 ENCOUNTER — Encounter (HOSPITAL_COMMUNITY): Payer: Self-pay | Admitting: Obstetrics & Gynecology

## 2021-09-29 DIAGNOSIS — O1404 Mild to moderate pre-eclampsia, complicating childbirth: Secondary | ICD-10-CM

## 2021-09-29 DIAGNOSIS — Z3A39 39 weeks gestation of pregnancy: Secondary | ICD-10-CM

## 2021-09-29 LAB — RPR: RPR Ser Ql: NONREACTIVE

## 2021-09-29 MED ORDER — LACTATED RINGERS IV SOLN
500.0000 mL | Freq: Once | INTRAVENOUS | Status: AC
  Administered 2021-09-29: 500 mL via INTRAVENOUS

## 2021-09-29 MED ORDER — MISOPROSTOL 200 MCG PO TABS
ORAL_TABLET | ORAL | Status: AC
  Filled 2021-09-29: qty 5

## 2021-09-29 MED ORDER — TERBUTALINE SULFATE 1 MG/ML IJ SOLN: 0.2500 mg | Freq: Once | INTRAMUSCULAR | Status: DC | PRN

## 2021-09-29 MED ORDER — LIDOCAINE HCL (PF) 1 % IJ SOLN
INTRAMUSCULAR | Status: DC | PRN
  Administered 2021-09-29: 11 mL via EPIDURAL

## 2021-09-29 MED ORDER — PHENYLEPHRINE 40 MCG/ML (10ML) SYRINGE FOR IV PUSH (FOR BLOOD PRESSURE SUPPORT): 80.0000 ug | PREFILLED_SYRINGE | INTRAVENOUS | Status: DC | PRN

## 2021-09-29 MED ORDER — EPHEDRINE 5 MG/ML INJ: 10.0000 mg | INTRAVENOUS | Status: DC | PRN

## 2021-09-29 MED ORDER — MISOPROSTOL 200 MCG PO TABS
1000.0000 ug | ORAL_TABLET | Freq: Once | ORAL | Status: AC
  Administered 2021-09-29: 1000 ug via RECTAL

## 2021-09-29 MED ORDER — OXYTOCIN-SODIUM CHLORIDE 30-0.9 UT/500ML-% IV SOLN
1.0000 m[IU]/min | INTRAVENOUS | Status: DC
  Administered 2021-09-29: 2 m[IU]/min via INTRAVENOUS

## 2021-09-29 MED ORDER — FENTANYL-BUPIVACAINE-NACL 0.5-0.125-0.9 MG/250ML-% EP SOLN
EPIDURAL | Status: AC
  Filled 2021-09-29: qty 250

## 2021-09-29 MED ORDER — FENTANYL-BUPIVACAINE-NACL 0.5-0.125-0.9 MG/250ML-% EP SOLN
12.0000 mL/h | EPIDURAL | Status: DC | PRN
  Administered 2021-09-29: 12 mL/h via EPIDURAL

## 2021-09-29 MED ORDER — PHENYLEPHRINE 40 MCG/ML (10ML) SYRINGE FOR IV PUSH (FOR BLOOD PRESSURE SUPPORT)
80.0000 ug | PREFILLED_SYRINGE | INTRAVENOUS | Status: DC | PRN
  Filled 2021-09-29: qty 10

## 2021-09-29 MED ORDER — DIPHENHYDRAMINE HCL 50 MG/ML IJ SOLN: 12.5000 mg | INTRAMUSCULAR | Status: DC | PRN

## 2021-09-29 NOTE — Progress Notes (Signed)
Labor Progress Note Melinda Wright is a 25 y.o. G1P0 at [redacted]w[redacted]d who presented for IOL due to gHTN.  S: Doing well. Feeling more pressure in her bottom. No other concerns at this time.  O:  BP (!) 141/62   Pulse 91   Temp 98.9 F (37.2 C) (Oral)   Resp 16   Ht 5\' 4"  (1.626 m)   Wt 83.9 kg   LMP 12/29/2020   SpO2 95%   BMI 31.76 kg/m   EFM: Baseline 125 bpm, moderate variability, + accels, variable decels   CVE: Dilation: 10 Dilation Complete Date: 09/29/21 Dilation Complete Time: 2029 Effacement (%): 100 Cervical Position: Posterior Station: Plus 1 Presentation: Vertex Exam by:: Dr. [email or web address]   A&P: 25 y.o. G1P0 [redacted]w[redacted]d   #Labor: Progressing well. Patient now complete and plus one station. Feeling pressure between contractions. Trial of pushing with good effort and small movement in fetal head. Will continue pushing and reassess at 1 hour mark.  #Pain: Epidural  #FWB: Cat 2 due to variable decels with contractions. Recovers well with moderate variability. Will continue to monitor. #GBS negative #Anticipated MOD: SVD   #gHTN: Normal to mild range pressures. No symptoms. Will continue to monitor.   [redacted]w[redacted]d, MD 8:53 PM

## 2021-09-29 NOTE — Anesthesia Preprocedure Evaluation (Signed)
Anesthesia Evaluation  Patient identified by MRN, date of birth, ID band Patient awake    Reviewed: Allergy & Precautions, H&P , NPO status , Patient's Chart, lab work & pertinent test results  Airway Mallampati: II  TM Distance: >3 FB Neck ROM: Full    Dental no notable dental hx.    Pulmonary neg pulmonary ROS,    Pulmonary exam normal breath sounds clear to auscultation       Cardiovascular hypertension, Normal cardiovascular exam Rhythm:Regular Rate:Normal     Neuro/Psych negative neurological ROS  negative psych ROS   GI/Hepatic negative GI ROS, Neg liver ROS,   Endo/Other  negative endocrine ROS  Renal/GU negative Renal ROS  negative genitourinary   Musculoskeletal negative musculoskeletal ROS (+)   Abdominal   Peds negative pediatric ROS (+)  Hematology negative hematology ROS (+)   Anesthesia Other Findings   Reproductive/Obstetrics (+) Pregnancy                             Anesthesia Physical Anesthesia Plan  ASA: 2  Anesthesia Plan: Epidural   Post-op Pain Management:    Induction:   PONV Risk Score and Plan:   Airway Management Planned:   Additional Equipment:   Intra-op Plan:   Post-operative Plan:   Informed Consent:   Plan Discussed with:   Anesthesia Plan Comments:         Anesthesia Quick Evaluation

## 2021-09-29 NOTE — Discharge Summary (Addendum)
Postpartum Discharge Summary     Patient Name: Melinda Wright DOB: 1996/04/29 MRN: [id number]  Date of admission: 09/28/2021 Delivery date:09/29/2021  Delivering provider: Greig Right RUTH  Date of discharge: 10/01/2021  Admitting diagnosis: Gestational hypertension affecting first pregnancy [O13.9] Intrauterine pregnancy: [redacted]w[redacted]d    Secondary diagnosis:  Principal Problem:   Vaginal delivery Active Problems:   Preeclampsia  Additional problems: none  Discharge diagnosis: Term Pregnancy Delivered and Preeclampsia (mild)                                              Post partum procedures: none Augmentation: AROM, Pitocin, Cytotec, and IP Foley Complications: None  Hospital course: Induction of Labor With Vaginal Delivery   25y.o. yo G1P0 at 337w1das admitted to the hospital 09/28/2021 for induction of labor.  Indication for induction: Gestational hypertension initially, however her urine P/C ratio was elevated at 0.65 giving a dx of pre-e without severe features. Patient had an uncomplicated labor course. She started her induction with Cytotec and foley balloon placement followed by Pitocin and AROM until she progressed to complete. She has an uncomplicated vaginal delivery.  Membrane Rupture Time/Date: 5:30 PM ,09/29/2021   Delivery Method:Vaginal, Spontaneous  Episiotomy: None  Lacerations:  1st degree;Periurethral;Labial  Details of delivery can be found in separate delivery note.  Patient had a routine postpartum course. She remained normotensive and did not require BP medication. Patient is discharged home 10/01/21.  Newborn Data: Birth date:09/29/2021  Birth time:9:50 PM  Gender:Female  Living status:Living  Apgars:8 ,9  Weight:3430 g   Magnesium Sulfate received: No BMZ received: No Rhophylac: N/A MMR: N/A - Immune  T-DaP: Given prenatally Flu: No Transfusion: No   Physical exam  Vitals:   09/30/21 1415 09/30/21 1842 09/30/21 2148 10/01/21 0530  BP:  107/70 108/75 114/65 (!) 107/57  Pulse: 67 67 68 65  Resp: _0 Temp: 98.5 F (36.9 C) 98.1 F (36.7 C) 98.2 F (36.8 C) 97.8 F (36.6 C)  TempSrc: Axillary Oral Oral Oral  SpO2: 99%  100% 100%  Weight:      Height:       General: alert, cooperative, and no distress Lochia: appropriate Uterine Fundus: firm DVT Evaluation: No edema or calf tenderness.  Labs: Lab Results  Component Value Date   WBC 17.3 (H) 09/30/2021   HGB 9.9 (L) 09/30/2021   HCT 31.0 (L) 09/30/2021   MCV 82.4 09/30/2021   PLT 195 09/30/2021   CMP Latest Ref Rng & Units 09/30/2021  Glucose 70 - 99 mg/dL 91  BUN 6 - 20 mg/dL 10  Creatinine 0.44 - 1.00 mg/dL 0.94  Sodium 135 - 145 mmol/L 138  Potassium 3.5 - 5.1 mmol/L 3.7  Chloride 98 - 111 mmol/L 103  CO2 22 - 32 mmol/L 22  Calcium 8.9 - 10.3 mg/dL 8.0(L)  Total Protein 6.5 - 8.1 g/dL 5.5(L)  Total Bilirubin 0.3 - 1.2 mg/dL 0.3  Alkaline Phos 38 - 126 U/L 141(H)  AST 15 - 41 U/L 41  ALT 0 - 44 U/L 14   Edinburgh Score: Edinburgh Postnatal Depression Scale Screening Tool 09/30/2021  I have been able to laugh and see the funny side of things. 0  I have looked forward with enjoyment to things. 0  I have blamed myself unnecessarily when things went wrong. 0  I have been anxious or worried for no good reason. 0  I have felt scared or panicky for no good reason. 0  Things have been getting on top of me. 0  I have been so unhappy that I have had difficulty sleeping. 0  I have felt sad or miserable. 0  I have been so unhappy that I have been crying. 0  The thought of harming myself has occurred to me. 0  Edinburgh Postnatal Depression Scale Total 0     After visit meds:  Allergies as of 10/01/2021   No Known Allergies      Medication List     TAKE these medications    acetaminophen 500 MG tablet Commonly known as: TYLENOL Take 2 tablets (1,000 mg total) by mouth every 8 (eight) hours as needed (for pain).   FLINSTONES GUMMIES  OMEGA-3 DHA PO Take by mouth. Takes 2 daily   ibuprofen 600 MG tablet Commonly known as: ADVIL Take 1 tablet (600 mg total) by mouth every 6 (six) hours as needed (for pain).         Discharge home in stable condition Infant Feeding: Breast Infant Disposition: home with mother Discharge instruction: per After Visit Summary and Postpartum booklet. Activity: Advance as tolerated. Pelvic rest for 6 weeks.  Diet: routine diet Future Appointments: Future Appointments  Date Time Provider [place]  10/02/2021  1:50 PM Roma Schanz, CNM [facility]   Follow up Visit: Message sent to [facility] by Dr. Gwenlyn Perking on 09/29/21.   Please schedule this patient for a In person postpartum visit in  4-6 weeks  with the following provider: Any provider. Additional Postpartum F/U: BP check 1 week  High risk pregnancy complicated by:  pre-e Delivery mode:  Vaginal, Spontaneous  Anticipated Birth Control:   None   10/01/2021 Myrtis Ser, CNM 9:29 AM

## 2021-09-29 NOTE — Progress Notes (Signed)
Melinda Wright is a 25 y.o. G1P0 at [redacted]w[redacted]d admitted for IOL for gHTN  Subjective: Melinda Wright is doing well. Reports contractions every 3-4 mins. She is breathing through contractions. Both significant other and patient's mother are present at bedside.   Objective: BP 132/83   Pulse 77   Temp 99.4 F (37.4 C) (Oral)   Resp (!) 126   Ht 5\' 4"  (1.626 m)   Wt 83.9 kg   LMP 12/29/2020   SpO2 98%   BMI 31.76 kg/m  No intake/output data recorded. No intake/output data recorded.  FHT: 135 bpm, moderate variability, +15x15 accels, no decels UC: Q 3-68mins SVE:   Dilation: 5 Effacement (%): 80 Station: -3 Exam by:: [email or web address] CNM  Labs: Lab Results  Component Value Date   WBC 7.7 09/28/2021   HGB 10.8 (L) 09/28/2021   HCT 33.5 (L) 09/28/2021   MCV 81.7 09/28/2021   PLT 215 09/28/2021    Assessment / Plan: Melinda Wright is a 25 y.o. G1P0 at [redacted]w[redacted]d here for IOL for gHTN.  Labor: s/p cytotec and FB. Pitocin started. Titrate prn per nursing policy. Consider AROM when appropriate. gHTN:   BP's normotensive to mild range, asymptomatic, labs stable, continue to monitor Fetal Wellbeing:  Category I Pain Control:  epidural prn I/D:  GBS negative Anticipated MOD:  NSVD   [redacted]w[redacted]d, CNM 09/29/2021, 9:58 AM

## 2021-09-29 NOTE — Progress Notes (Signed)
Melinda Wright is a 25 y.o. G1P0 at [redacted]w[redacted]d  admitted for IOL for gHTN Objective: BP 132/75   Pulse 69   Temp 98.1 F (36.7 C) (Oral)   Resp (!) 126   Ht 5\' 4"  (1.626 m)   Wt 83.9 kg   LMP 12/29/2020   SpO2 98%   BMI 31.76 kg/m  No intake/output data recorded. No intake/output data recorded.   FHT:  FHR: 130 bpm, variability: moderate,  accelerations:  Present,  decelerations:  Absent UC: irregular SVE:   Dilation: 4 Effacement (%): 60 Station: -3 Exam by:: [email or web address], RN    Cooks balloon out Last cytotec 2300 - Start pitocin now   #gHTN Normotensive with last few checks - continue to monitor  Jordan Hawks, MD, MPH OB Fellow, Faculty Practice

## 2021-09-29 NOTE — Lactation Note (Signed)
This note was copied from a baby's chart. Lactation Consultation Note  Patient Name: Melinda Wright [id number] Date: 09/29/2021 Reason for consult: L&D Initial assessment;Term;Primapara;1st time breastfeeding Age:23 hours  L&D consult with <60 minutes old infant and P1 mother. Congratulated family on newborn.  Assisted with latch, laid back position. Colostrum easily expressed. Mother has short-shafted nipple may benefit from a hand pump for nipple eversion. LC encouraged "sandwich" breast to attain a deep latch.   Discussed STS as ideal transition for infants after birth. Talked about primal reflexes. Explained LC services availability during postpartum stay. Thanked family for their time.      Maternal Data Has patient been taught Hand Expression?: Yes Does the patient have breastfeeding experience prior to this delivery?: No  Feeding Mother's Current Feeding Choice: Breast Milk  LATCH Score Latch: Grasps breast easily, tongue down, lips flanged, rhythmical sucking.  Audible Swallowing: Spontaneous and intermittent  Type of Nipple: Everted at rest and after stimulation (short shafted)  Comfort (Breast/Nipple): Soft / non-tender  Hold (Positioning): Assistance needed to correctly position infant at breast and maintain latch.  LATCH Score: 9  Interventions Interventions: Breast feeding basics reviewed;Skin to skin;Hand express;Assisted with latch;Education;Expressed milk  Consult Status Consult Status: Follow-up from L&D    Shandell Jallow A Higuera Ancidey 09/29/2021, 10:49 PM

## 2021-09-29 NOTE — Progress Notes (Signed)
Labor Progress Note Melinda Wright is a 25 y.o. G1P0 at [redacted]w[redacted]d presented for IOL for gHTN.  S: Comfortable with epidural. Not feeling pain or pressure.   O:  BP 119/74   Pulse 88   Temp 98.4 F (36.9 C) (Oral)   Resp 16   Ht 5\' 4"  (1.626 m)   Wt 83.9 kg   LMP 12/29/2020   SpO2 95%   BMI 31.76 kg/m  FHT Baseline rate: 130 bpm Variability: Moderate Accels: + Decels: None  CVE: Dilation: 6 Effacement (%): 90 Cervical Position: Posterior Station: -1 Presentation: Vertex Exam by:: [email or web address] CNM   A&P: 25 y.o. G1P0 [redacted]w[redacted]d   #Labor: Progressing well. Cervix soft and thinned out. AROM'ed at 1730, risks and benefits discussed with patient- consent obtained. #Pain: Epidural #FWB: Cat 1 #GBS negative  #gHTN: BP well-controlled. One elevated reading at 1156 of 141/85, otherwise normotensive  [redacted]w[redacted]d, DO 5:40 PM

## 2021-09-29 NOTE — Anesthesia Procedure Notes (Signed)
Epidural Patient location during procedure: OB Start time: 09/29/2021 11:32 AM End time: 09/29/2021 11:41 AM  Staffing Anesthesiologist: Lowella Curb, MD Performed: anesthesiologist   Preanesthetic Checklist Completed: patient identified, IV checked, site marked, risks and benefits discussed, surgical consent, monitors and equipment checked, pre-op evaluation and timeout performed  Epidural Patient position: sitting Prep: ChloraPrep Patient monitoring: heart rate, cardiac monitor, continuous pulse ox and blood pressure Approach: midline Location: L2-L3 Injection technique: LOR saline  Needle:  Needle type: Tuohy  Needle gauge: 17 G Needle length: 9 cm Needle insertion depth: 6 cm Catheter type: closed end flexible Catheter size: 20 Guage Catheter at skin depth: 10 cm Test dose: negative  Assessment Events: blood not aspirated, injection not painful, no injection resistance, no paresthesia and negative IV test  Additional Notes Reason for block:procedure for pain

## 2021-09-30 DIAGNOSIS — O1490 Unspecified pre-eclampsia, unspecified trimester: Secondary | ICD-10-CM | POA: Diagnosis not present

## 2021-09-30 LAB — COMPREHENSIVE METABOLIC PANEL
ALT: 14 U/L (ref 0–44)
AST: 41 U/L (ref 15–41)
Albumin: 2.5 g/dL — ABNORMAL LOW (ref 3.5–5.0)
Alkaline Phosphatase: 141 U/L — ABNORMAL HIGH (ref 38–126)
Anion gap: 13 (ref 5–15)
BUN: 10 mg/dL (ref 6–20)
CO2: 22 mmol/L (ref 22–32)
Calcium: 8 mg/dL — ABNORMAL LOW (ref 8.9–10.3)
Chloride: 103 mmol/L (ref 98–111)
Creatinine, Ser: 0.94 mg/dL (ref 0.44–1.00)
GFR, Estimated: >60 mL/min (ref >60)
Glucose, Bld: 91 mg/dL (ref 70–99)
Potassium: 3.7 mmol/L (ref 3.5–5.1)
Sodium: 138 mmol/L (ref 135–145)
Total Bilirubin: 0.3 mg/dL (ref 0.3–1.2)
Total Protein: 5.5 g/dL — ABNORMAL LOW (ref 6.5–8.1)

## 2021-09-30 LAB — CBC
HCT: 31 % — ABNORMAL LOW (ref 36.0–46.0)
Hemoglobin: 9.9 g/dL — ABNORMAL LOW (ref 12.0–15.0)
MCH: 26.3 pg (ref 26.0–34.0)
MCHC: 31.9 g/dL (ref 30.0–36.0)
MCV: 82.4 fL (ref 80.0–100.0)
Platelets: 195 10*3/uL (ref 150–400)
RBC: 3.76 MIL/uL — ABNORMAL LOW (ref 3.87–5.11)
RDW: 14.5 % (ref 11.5–15.5)
WBC: 17.3 10*3/uL — ABNORMAL HIGH (ref 4.0–10.5)
nRBC: 0 % (ref 0.0–0.2)

## 2021-09-30 MED ORDER — IBUPROFEN 600 MG PO TABS
600.0000 mg | ORAL_TABLET | Freq: Four times a day (QID) | ORAL | Status: DC
  Administered 2021-09-30 – 2021-10-01 (×7): 600 mg via ORAL
  Filled 2021-09-30 (×7): qty 1

## 2021-09-30 MED ORDER — WITCH HAZEL-GLYCERIN EX PADS: 1.0000 "application " | MEDICATED_PAD | CUTANEOUS | Status: DC | PRN

## 2021-09-30 MED ORDER — DIPHENHYDRAMINE HCL 25 MG PO CAPS: 25.0000 mg | ORAL_CAPSULE | Freq: Four times a day (QID) | ORAL | Status: DC | PRN

## 2021-09-30 MED ORDER — ONDANSETRON HCL 4 MG/2ML IJ SOLN: 4.0000 mg | INTRAMUSCULAR | Status: DC | PRN

## 2021-09-30 MED ORDER — DIBUCAINE (PERIANAL) 1 % EX OINT: 1.0000 "application " | TOPICAL_OINTMENT | CUTANEOUS | Status: DC | PRN

## 2021-09-30 MED ORDER — COCONUT OIL OIL: 1.0000 "application " | TOPICAL_OIL | Status: DC | PRN

## 2021-09-30 MED ORDER — SENNOSIDES-DOCUSATE SODIUM 8.6-50 MG PO TABS
2.0000 | ORAL_TABLET | Freq: Every day | ORAL | Status: DC
  Administered 2021-09-30: 2 via ORAL
  Filled 2021-09-30 (×2): qty 2

## 2021-09-30 MED ORDER — ONDANSETRON HCL 4 MG PO TABS: 4.0000 mg | ORAL_TABLET | ORAL | Status: DC | PRN

## 2021-09-30 MED ORDER — PRENATAL MULTIVITAMIN CH
1.0000 | ORAL_TABLET | Freq: Every day | ORAL | Status: DC
  Administered 2021-09-30 – 2021-10-01 (×2): 1 via ORAL
  Filled 2021-09-30 (×2): qty 1

## 2021-09-30 MED ORDER — SIMETHICONE 80 MG PO CHEW: 80.0000 mg | CHEWABLE_TABLET | ORAL | Status: DC | PRN

## 2021-09-30 MED ORDER — ACETAMINOPHEN 325 MG PO TABS
650.0000 mg | ORAL_TABLET | ORAL | Status: DC | PRN
  Administered 2021-09-30: 650 mg via ORAL
  Filled 2021-09-30: qty 2

## 2021-09-30 MED ORDER — BENZOCAINE-MENTHOL 20-0.5 % EX AERO
1.0000 "application " | INHALATION_SPRAY | CUTANEOUS | Status: DC | PRN
  Filled 2021-09-30: qty 56

## 2021-09-30 NOTE — Anesthesia Postprocedure Evaluation (Signed)
Anesthesia Post Note  Patient: Melinda Wright  Procedure(s) Performed: AN AD HOC LABOR EPIDURAL     Patient location during evaluation: Mother Baby Anesthesia Type: Epidural Level of consciousness: awake and alert and oriented Pain management: satisfactory to patient Vital Signs Assessment: post-procedure vital signs reviewed and stable Respiratory status: respiratory function stable Cardiovascular status: stable Postop Assessment: no headache, no backache, epidural receding, patient able to bend at knees, no signs of nausea or vomiting, adequate PO intake and able to ambulate Anesthetic complications: no   No notable events documented.  Last Vitals:  Vitals:   09/30/21 0140 09/30/21 0548  BP: 104/61 122/62  Pulse: 76 67  Resp: 18 18  Temp: 37.5 C 36.9 C  SpO2: 100% 100%    Last Pain:  Vitals:   09/30/21 0845  TempSrc:   PainSc: 0-No pain   Pain Goal: Patients Stated Pain Goal: 0 (09/29/21 1505)                 [name]

## 2021-09-30 NOTE — Lactation Note (Signed)
This note was copied from a baby's chart. Lactation Consultation Note  Patient Name: Melinda Wright [id number] Date: 09/30/2021 Reason for consult: Initial assessment Age:23 Hours  P1 Mother reports that infant has breastfed several times. Mother assist with hand expression. Infant latched on with assistance. Infant sustained latch for 13 mins. Infant was observed with suckling and swallowing.  Mother advised to continue to cue base feed infant and fed infant at least 8-12 times in 24 hours. Mother to continue to do frequent STS. Mother to allow for cluster feeding after 24 hours of life.   Maternal Data Has patient been taught Hand Expression?: Yes Does the patient have breastfeeding experience prior to this delivery?: No  Feeding Mother's Current Feeding Choice: Breast Milk  LATCH Score Latch: Grasps breast easily, tongue down, lips flanged, rhythmical sucking.  Audible Swallowing: Spontaneous and intermittent  Type of Nipple: Everted at rest and after stimulation  Comfort (Breast/Nipple): Soft / non-tender  Hold (Positioning): Assistance needed to correctly position infant at breast and maintain latch.  LATCH Score: 9   Lactation Tools Discussed/Used    Interventions Interventions: Breast feeding basics reviewed;Assisted with latch;Skin to skin;Hand express;Breast compression;Adjust position;Support pillows;Position options;Hand pump  Discharge    Consult Status Consult Status: Follow-up Date: 09/30/21 Follow-up type: In-patient    Stevan Born [facility] 09/30/2021, 1:21 PM

## 2021-09-30 NOTE — Progress Notes (Addendum)
Post Partum Day 1 Subjective: Doing well. No acute events overnight. Pain is controlled and bleeding is appropriate. She is drinking, voiding, and ambulating without issue. She has not attempted to eat yet. She has no other concerns at this time.  Objective: Blood pressure 104/61, pulse 76, temperature 99.5 F (37.5 C), temperature source Oral, resp. rate 18, height 5\' 4"  (1.626 m), weight 83.9 kg, last menstrual period 12/29/2020, SpO2 100 %, unknown if currently breastfeeding.  Physical Exam:  General: alert, cooperative, and no distress Lochia: appropriate Uterine Fundus: firm and below umbilicus  DVT Evaluation: no LE edema or calf tenderness to palpation   Recent Labs    09/28/21 1659  HGB 10.8*  HCT 33.5*    Assessment/Plan: Melinda Wright is a 25 y.o. G1P1001 on PPD# 1 s/p SVD after IOL for gHTN.  Progressing well. Meeting postpartum milestones. VSS. Continue routine postpartum care.  #gHTN: -BP stable postpartum and within normal range -No symptoms -Will continue to monitor   Feeding: Breast Contraception: None Circumcision: Yes - consented at bedside   Dispo: Plan for discharge on PPD#2    LOS: 2 days   30, MD  09/30/2021, 5:03 AM   GME ATTESTATION:  I saw and evaluated the patient. I agree with the findings and the plan of care as documented in the resident's note. I have made changes to documentation as necessary.  10/02/2021, MD OB Fellow, Faculty [facility], Center for [facility] 09/30/2021 8:49 AM

## 2021-10-01 MED ORDER — IBUPROFEN 600 MG PO TABS: 600.0000 mg | ORAL_TABLET | Freq: Four times a day (QID) | ORAL | 0 refills | Status: DC | PRN

## 2021-10-01 MED ORDER — ACETAMINOPHEN 500 MG PO TABS
1000.0000 mg | ORAL_TABLET | Freq: Three times a day (TID) | ORAL | 0 refills | Status: DC | PRN
Start: 2021-10-01 — End: 2024-05-09

## 2021-10-02 ENCOUNTER — Encounter: Payer: BC Managed Care – PPO | Admitting: Women's Health

## 2021-10-02 ENCOUNTER — Other Ambulatory Visit: Payer: Self-pay

## 2021-10-02 ENCOUNTER — Other Ambulatory Visit (INDEPENDENT_AMBULATORY_CARE_PROVIDER_SITE_OTHER): Payer: BC Managed Care – PPO | Admitting: *Deleted

## 2021-10-02 DIAGNOSIS — Z0130 Encounter for examination of blood pressure without abnormal findings: Secondary | ICD-10-CM

## 2021-10-02 NOTE — Progress Notes (Addendum)
   NURSE VISIT- BLOOD PRESSURE CHECK  SUBJECTIVE:  Melinda Wright is a 25 y.o. G79P1001 female here for BP check. She is postpartum, delivery date 09/29/21     HYPERTENSION ROS:  Pregnant/postpartum:  Severe headaches that don't go away with tylenol/other medicines: No  Visual changes (seeing spots/double/blurred vision) No  Severe pain under right breast breast or in center of upper chest No  Severe nausea/vomiting No  Taking medicines as instructed not applicable   OBJECTIVE:  BP 125/70 (BP Location: Left Arm, Patient Position: Sitting, Cuff Size: Normal)   Pulse 84   Wt 168 lb 3.2 oz (76.3 kg)   BMI 28.87 kg/m   Appearance alert, well appearing, and in no distress.  ASSESSMENT: Postpartum  blood pressure check  PLAN: Discussed with Dr. Charlotta Newton   Recommendations: no changes needed   Follow-up: as scheduled   Annamarie Dawley  10/02/2021 2:19 PM   Chart reviewed for nurse visit. Agree with plan of care.  Myna Hidalgo, DO 10/02/2021 3:37 PM

## 2021-10-12 ENCOUNTER — Telehealth (HOSPITAL_COMMUNITY): Payer: Self-pay | Admitting: *Deleted

## 2021-10-12 NOTE — Telephone Encounter (Signed)
Hospital Discharge Follow-Up Call:  Patient reports that she is well and has no concerns about her healing process.  EPDS today was 0 and she endorses this accurately reflects she is doing well emotionally.  Patient says that baby is well and she has no concerns about his health.  She reports that baby sleeps in a bassinet beside her bed.  ABCs of Safe Sleep reviewed.

## 2021-11-08 ENCOUNTER — Ambulatory Visit: Payer: BC Managed Care – PPO | Admitting: Advanced Practice Midwife

## 2021-11-10 ENCOUNTER — Ambulatory Visit: Payer: BC Managed Care – PPO | Admitting: Women's Health

## 2021-11-13 ENCOUNTER — Encounter: Payer: Self-pay | Admitting: Women's Health

## 2021-11-13 ENCOUNTER — Ambulatory Visit (INDEPENDENT_AMBULATORY_CARE_PROVIDER_SITE_OTHER): Payer: BC Managed Care – PPO | Admitting: Women's Health

## 2021-11-13 ENCOUNTER — Other Ambulatory Visit: Payer: Self-pay

## 2021-11-13 DIAGNOSIS — Z3009 Encounter for other general counseling and advice on contraception: Secondary | ICD-10-CM

## 2021-11-13 DIAGNOSIS — R42 Dizziness and giddiness: Secondary | ICD-10-CM | POA: Diagnosis not present

## 2021-11-13 NOTE — Progress Notes (Signed)
POSTPARTUM VISIT Patient name: Melinda Wright MRN [id number]  Date of birth: 03-Aug-1996 Chief Complaint:   Postpartum Care (When get up fast get dizzy, feel that way res of day)  History of Present Illness:   Melinda Wright is a 26 y.o. G34P1001 Caucasian female being seen today for a postpartum visit. She is 6 weeks postpartum following a spontaneous vaginal delivery at 39.1 gestational weeks. IOL: yes, for gestational hypertension>mild pre-e . Anesthesia: epidural.  Laceration: 1st degree, labial, and periurethral.  Complications: none. Inpatient contraception: no.   Pregnancy complicated by [facility] . Tobacco use: no. Substance use disorder: no. Last pap smear: 04/04/21 and results were NILM w/ HRHPV not done. Next pap smear due: 2025 No LMP recorded. (Menstrual status: Lactating).  Postpartum course has been complicated by dizziness when standing x ~1wk . Bleeding scant staining. Bowel function is normal. Bladder function is normal. Urinary incontinence? no, fecal incontinence? no Patient is sexually active. Last sexual activity:  2 nights ago, started then stopped . Desired contraception:  w/drawal . Patient does not want a pregnancy in the future.  Desired family size is 1 children.   Upstream - 11/13/21 1633       Pregnancy Intention Screening   Does the patient want to become pregnant in the next year? No    Does the patient's partner want to become pregnant in the next year? No    Would the patient like to discuss contraceptive options today? No      Contraception Wrap Up   Current Method Abstinence    End Method Abstinence    Contraception Counseling Provided No            The pregnancy intention screening data noted above was reviewed. Potential methods of contraception were discussed. The patient elected to proceed with Abstinence.  Edinburgh Postpartum Depression Screening: negative  Edinburgh Postnatal Depression Scale - 11/13/21 1621       Edinburgh Postnatal  Depression Scale:  In the Past 7 Days   I have been able to laugh and see the funny side of things. 0    I have looked forward with enjoyment to things. 0    I have blamed myself unnecessarily when things went wrong. 0    I have been anxious or worried for no good reason. 0    I have felt scared or panicky for no good reason. 0    Things have been getting on top of me. 0    I have been so unhappy that I have had difficulty sleeping. 0    I have felt sad or miserable. 0    I have been so unhappy that I have been crying. 0    The thought of harming myself has occurred to me. 0    Edinburgh Postnatal Depression Scale Total 0             GAD 7 : Generalized Anxiety Score 06/30/2021 04/04/2021 02/16/2021  Nervous, Anxious, on Edge 0 0 0  Control/stop worrying 0 0 0  Worry too much - different things 0 0 0  Trouble relaxing 0 0 0  Restless 0 0 0  Easily annoyed or irritable 0 0 0  Afraid - awful might happen 0 0 0  Total GAD 7 Score 0 0 0     Baby's course has been uncomplicated. Baby is feeding by breast: milk supply adequate. Infant has a pediatrician/family doctor? Yes.  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  Pt  has material needs met for her and baby: Yes.   Review of Systems:   Pertinent items are noted in HPI Denies Abnormal vaginal discharge w/ itching/odor/irritation, headaches, visual changes, shortness of breath, chest pain, abdominal pain, severe nausea/vomiting, or problems with urination or bowel movements. Pertinent History Reviewed:  Reviewed past medical,surgical, obstetrical and family history.  Reviewed problem list, medications and allergies. OB History  Gravida Para Term Preterm AB Living  $Remov'1 1 1     1  'HKHdZy$ SAB IAB Ectopic Multiple Live Births        0 1    # Outcome Date GA Lbr Len/2nd Weight Sex Delivery Anes PTL Lv  1 Term 09/29/21 [redacted]w[redacted]d 11:29 / 01:21 7 lb 9 oz (3.43 kg) M Vag-Spont EPI, Local  LIV   Physical Assessment:   Vitals:    11/13/21 1616  BP: 116/73  Pulse: 95  Weight: 161 lb (73 kg)  Height: $Remove'5\' 5"'lbrMCFb$  (1.651 m)  Body mass index is 26.79 kg/m.       Physical Examination:   General appearance: alert, well appearing, and in no distress  Mental status: alert, oriented to person, place, and time  Skin: warm & dry   Cardiovascular: normal heart rate noted   Respiratory: normal respiratory effort, no distress   Breasts: deferred, no complaints   Abdomen: soft, non-tender   Pelvic: lacs healing well. Thin prep pap obtained: No  Rectal: not examined  Extremities: Edema: none   Chaperone:  none          No results found for this or any previous visit (from the past 24 hour(s)).  Assessment & Plan:  1) Postpartum exam 2) 6 wks s/p spontaneous vaginal delivery after IOL for GHTN>pre-e 3) breast feeding 4) Depression screening 5) Contraception counseling> plans w/drawal or condoms, will let [place] know if she wants birth control from [place] 6) Dizziness> will check hgb  Essential components of care per ACOG recommendations:  1.  Mood and well being:  If positive depression screen, discussed and plan developed.  If using tobacco we discussed reduction/cessation and risk of relapse If current substance abuse, we discussed and referral to local resources was offered.   2. Infant care and feeding:  If breastfeeding, discussed returning to work, pumping, breastfeeding-associated pain, guidance regarding return to fertility while lactating if not using another method. If needed, patient was provided with a letter to be allowed to pump q 2-3hrs to support lactation in a private location with access to a refrigerator to store breastmilk.   Recommended that all caregivers be immunized for flu, pertussis and other preventable communicable diseases If pt does not have material needs met for her/baby, referred to local resources for help obtaining these.  3. Sexuality, contraception and birth spacing Provided guidance regarding  sexuality, management of dyspareunia, and resumption of intercourse Discussed avoiding interpregnancy interval <24mths and recommended birth spacing of 18 months  4. Sleep and fatigue Discussed coping options for fatigue and sleep disruption Encouraged family/partner/community support of 4 hrs of uninterrupted sleep to help with mood and fatigue  5. Physical recovery  If pt had a C/S, assessed incisional pain and providing guidance on normal vs prolonged recovery If pt had a laceration, perineal healing and pain reviewed.  If urinary or fecal incontinence, discussed management and referred to PT or uro/gyn if indicated  Patient is safe to resume physical activity. Discussed attainment of healthy weight.  6.  Chronic disease management Discussed pregnancy complications if any, and their implications  for future childbearing and long-term maternal health. Review recommendations for prevention of recurrent pregnancy complications, such as 17 hydroxyprogesterone caproate to reduce risk for recurrent PTB not applicable, or aspirin to reduce risk of preeclampsia yes. Pt had GDM: no. If yes, 2hr GTT scheduled: not applicable. Reviewed medications and non-pregnant dosing including consideration of whether pt is breastfeeding using a reliable resource such as LactMed: not applicable Referred for f/u w/ PCP or subspecialist providers as indicated: not applicable  7. Health maintenance Mammogram at 24yo or earlier if indicated Pap smears as indicated  Meds: No orders of the defined types were placed in this encounter.   Follow-up: Return in about 1 year (around 11/13/2022) for Physical.   Orders Placed This Encounter  Procedures   [place], [facility] 11/13/2021 4:49 PM

## 2021-11-14 LAB — CBC
Hematocrit: 39.3 % (ref 34.0–46.6)
Hemoglobin: 12.4 g/dL (ref 11.1–15.9)
MCH: 25.4 pg — ABNORMAL LOW (ref 26.6–33.0)
MCHC: 31.6 g/dL (ref 31.5–35.7)
MCV: 80 fL (ref 79–97)
Platelets: 294 10*3/uL (ref 150–450)
RBC: 4.89 x10E6/uL (ref 3.77–5.28)
RDW: 14.9 % (ref 11.7–15.4)
WBC: 4.6 10*3/uL (ref 3.4–10.8)

## 2021-12-13 ENCOUNTER — Encounter: Payer: Self-pay | Admitting: Obstetrics & Gynecology

## 2021-12-13 ENCOUNTER — Ambulatory Visit (INDEPENDENT_AMBULATORY_CARE_PROVIDER_SITE_OTHER): Payer: BC Managed Care – PPO | Admitting: Obstetrics & Gynecology

## 2021-12-13 ENCOUNTER — Other Ambulatory Visit: Payer: Self-pay

## 2021-12-13 ENCOUNTER — Telehealth: Payer: Self-pay | Admitting: Obstetrics & Gynecology

## 2021-12-13 VITALS — BP 115/74 | HR 108 | Temp 98.4°F | Ht 65.0 in | Wt 151.6 lb

## 2021-12-13 DIAGNOSIS — N61 Mastitis without abscess: Secondary | ICD-10-CM | POA: Diagnosis not present

## 2021-12-13 MED ORDER — DICLOXACILLIN SODIUM 500 MG PO CAPS: 500.0000 mg | ORAL_CAPSULE | Freq: Four times a day (QID) | ORAL | 0 refills | Status: DC

## 2021-12-13 NOTE — Telephone Encounter (Signed)
Patient calling stating that the script for dynapen that was sent to [facility] pharmacy that they don't have in stock and the only place that does is [facility]  in [place] if we could have the script sent to [facility] please

## 2021-12-13 NOTE — Progress Notes (Signed)
° °  GYN VISIT Patient name: Melinda Wright MRN [id number]  Date of birth: Apr 13, 1996 Chief Complaint:   Mastitis  History of Present Illness:   Melinda Wright is a 26 y.o. G69P1001 female being seen today for the following concerns:  Breastfeeding issues: Last few days noted right soreness, then last night noted body chills, aches/pains and temp. Notes extreme soreness and redness.  Still able to pump and feed.  Taking tylenol and ibuprofen, fever last night 101.6.     No urinary concerns.  Denies irregular discharge, itching or irritation.  Denies pelvic or abdominal pain.  Denies OSB/Chest pain.  Denies sore throat, cough or other upper respiratory symptoms.  No LMP recorded. (Menstrual status: Lactating).  Depression screen [facility] 2/9 06/30/2021 04/04/2021 02/16/2021  Decreased Interest 0 0 0  Down, Depressed, Hopeless 0 0 0  PHQ - 2 Score 0 0 0  Altered sleeping 1 1 2   Tired, decreased energy 1 0 2  Change in appetite 0 0 0  Feeling bad or failure about yourself  0 0 0  Trouble concentrating 0 0 0  Moving slowly or fidgety/restless 0 0 0  Suicidal thoughts 0 0 0  PHQ-9 Score 2 1 4      Review of Systems:   Pertinent items are noted in HPI Denies shortness of breath, chest pain, abdominal pain, vomiting, bowel movements, urination, or intercourse unless otherwise stated above.  Pertinent History Reviewed:  Reviewed past medical,surgical, social, obstetrical and family history.  Reviewed problem list, medications and allergies. Physical Assessment:   Vitals:   12/13/21 1338  BP: 115/74  Pulse: (!) 108  Temp: 98.4 F (36.9 C)  Weight: 151 lb 9.6 oz (68.8 kg)  Height: 5\' 5"  (1.651 m)  Body mass index is 25.23 kg/m.       Physical Examination:   General appearance: alert, well appearing, and in no distress  Psych: mood appropriate, normal affect  Skin: warm & dry   Cardiovascular: normal heart rate noted  Respiratory: normal respiratory effort, no distress  Breast:  normal left breast, right breast with ~ 3cm area of erythema- no fluctuation/induration, significant tenderness noted  Abdomen: soft, non-tender   Pelvic: examination not indicated  Extremities: no edema   Chaperone: N/A    Assessment & Plan:  1) Mastitis -reviewed conservative management- including warm packs -reassured pt that systemic symptoms can be common with mastitis -OTC Tylenol/ibuprofen -continued feeding -Antibiotic sent in, pt call in if worsening/no improvement of symptoms  Meds ordered this encounter  Medications   dicloxacillin (DYNAPEN) 500 MG capsule    Sig: Take 1 capsule (500 mg total) by mouth 4 (four) times daily for 10 days.    Dispense:  40 capsule    Refill:  0    , DO Attending Obstetrician & Gynecologist, [facility] for , [facility]

## 2021-12-14 ENCOUNTER — Ambulatory Visit: Payer: BC Managed Care – PPO | Admitting: Obstetrics & Gynecology

## 2021-12-14 MED ORDER — DICLOXACILLIN SODIUM 500 MG PO CAPS: 500.0000 mg | ORAL_CAPSULE | Freq: Four times a day (QID) | ORAL | 0 refills | Status: AC

## 2021-12-14 NOTE — Telephone Encounter (Signed)
Called pt to inform her that Dr Charlotta Newton sent in prescription. Two identifiers used. Pt stated that someone had called her and informed her that it was ready for her at [facility] pharmacy, so she picked it up yesterday. Told pt to disregard new prescription. Pharmacy called to inform them of new prescription sent. Pharmacy confirmed.

## 2021-12-14 NOTE — Addendum Note (Signed)
Addended by: Sharon Seller on: 12/14/2021 02:02 PM   Modules accepted: Orders

## 2021-12-20 ENCOUNTER — Encounter: Payer: Self-pay | Admitting: Obstetrics & Gynecology

## 2022-02-02 DIAGNOSIS — Z0000 Encounter for general adult medical examination without abnormal findings: Secondary | ICD-10-CM | POA: Diagnosis not present

## 2022-04-05 ENCOUNTER — Ambulatory Visit
Admission: RE | Admit: 2022-04-05 | Discharge: 2022-04-05 | Disposition: A | Discharge status: Home / Self Care | Payer: BC Managed Care – PPO | Source: Ambulatory Visit

## 2022-04-05 ENCOUNTER — Ambulatory Visit
Admission: EM | Admit: 2022-04-05 | Discharge: 2022-04-05 | Disposition: A | Discharge status: Home / Self Care | Payer: BC Managed Care – PPO | Source: Ambulatory Visit | Attending: Nurse Practitioner | Admitting: Nurse Practitioner

## 2022-04-05 DIAGNOSIS — J029 Acute pharyngitis, unspecified: Secondary | ICD-10-CM | POA: Diagnosis not present

## 2022-04-05 DIAGNOSIS — H6993 Unspecified Eustachian tube disorder, bilateral: Secondary | ICD-10-CM | POA: Diagnosis not present

## 2022-04-05 LAB — POCT RAPID STREP A (OFFICE): Rapid Strep A Screen: NEGATIVE

## 2022-04-05 MED ORDER — FLUTICASONE PROPIONATE 50 MCG/ACT NA SUSP: 2.0000 | Freq: Every day | NASAL | 0 refills | Status: DC

## 2022-04-05 MED ORDER — PREDNISONE 10 MG PO TABS: 30.0000 mg | ORAL_TABLET | Freq: Every day | ORAL | 0 refills | Status: AC

## 2022-04-05 NOTE — ED Provider Notes (Signed)
[facility]    CSN: [id number] Arrival date & time: 04/05/22  1652      History   Chief Complaint Chief Complaint  Patient presents with   Ear Fullness    HPI Melinda Wright is a 26 y.o. female.   The history is provided by the patient.   Patient presents for complaints of bilateral ear fullness, sore throat, and left ear pain.  Symptoms have been present for the past week.  Patient reports the ear fullness and left ear pain have worsened over the past 2 to 3 days.  She denies fever, chills, nasal congestion, cough, or GI symptoms.  Patient is currently breast-feeding and has not taken any medication for her symptoms.  Past Medical History:  Diagnosis Date   Foot fracture 2012   left metatarsal fracture   Medical history non-contributory     Patient Active Problem List   Diagnosis Date Noted   H/O Preeclampsia 09/30/2021    Past Surgical History:  Procedure Laterality Date   NO PAST SURGERIES      OB History     Gravida  1   Para  1   Term  1   Preterm      AB      Living  1      SAB      IAB      Ectopic      Multiple  0   Live Births  1            Home Medications    Prior to Admission medications   Medication Sig Start Date End Date Taking? Authorizing Provider  fluticasone (FLONASE) 50 MCG/ACT nasal spray Place 2 sprays into both nostrils daily. 04/05/22  Yes Jendayi Berling-Warren, Sadie Haber, NP  predniSONE (DELTASONE) 10 MG tablet Take 3 tablets (30 mg total) by mouth daily with breakfast for 5 days. 04/05/22 04/10/22 Yes Maple Odaniel-Warren, Sadie Haber, NP  acetaminophen (TYLENOL) 500 MG tablet Take 2 tablets (1,000 mg total) by mouth every 8 (eight) hours as needed (for pain). 10/01/21   Angela Cox, MD  ibuprofen (ADVIL) 600 MG tablet Take 1 tablet (600 mg total) by mouth every 6 (six) hours as needed (for pain). 10/01/21   Angela Cox, MD  Pediatric Multiple Vitamins (FLINSTONES GUMMIES OMEGA-3 DHA PO) Take by mouth.  Takes 2 daily    [provider]    Family History Family History  Problem Relation Age of Onset   COPD Paternal Grandfather    Thyroid disease Maternal Grandmother     Social History Social History   Tobacco Use   Smoking status: Never   Smokeless tobacco: Never  Vaping Use   Vaping Use: Never used  Substance Use Topics   Alcohol use: Not Currently   Drug use: Never     Allergies   Patient has no known allergies.   Review of Systems Review of Systems Per HPI  Physical Exam Triage Vital Signs ED Triage Vitals  Enc Vitals Group     BP 04/05/22 1706 132/81     Pulse Rate 04/05/22 1706 82     Resp 04/05/22 1706 16     Temp 04/05/22 1706 99 F (37.2 C)     Temp Source 04/05/22 1706 Oral     SpO2 04/05/22 1706 96 %     Weight --      Height --      Head Circumference --      Peak Flow --  Pain Score 04/05/22 1704 6     Pain Loc --      Pain Edu? --      Excl. in GC? --    No data found.  Updated Vital Signs BP 132/81 (BP Location: Right Arm)   Pulse 82   Temp 99 F (37.2 C) (Oral)   Resp 16   SpO2 96%   Breastfeeding No   Visual Acuity Right Eye Distance:   Left Eye Distance:   Bilateral Distance:    Right Eye Near:   Left Eye Near:    Bilateral Near:     Physical Exam Vitals and nursing note reviewed.  Constitutional:      Appearance: Normal appearance.  HENT:     Head: Normocephalic.     Right Ear: Ear canal and external ear normal. A middle ear effusion is present.     Left Ear: Ear canal and external ear normal. A middle ear effusion is present.     Nose: Nose normal.     Mouth/Throat:     Mouth: Mucous membranes are moist.     Pharynx: Oropharyngeal exudate and posterior oropharyngeal erythema present.     Tonsils: Tonsillar exudate present. 1+ on the right. 1+ on the left.  Eyes:     Extraocular Movements: Extraocular movements intact.     Conjunctiva/sclera: Conjunctivae normal.     Pupils: Pupils are equal, round,  and reactive to light.  Cardiovascular:     Rate and Rhythm: Normal rate and regular rhythm.     Pulses: Normal pulses.     Heart sounds: Normal heart sounds.  Pulmonary:     Effort: Pulmonary effort is normal.     Breath sounds: Normal breath sounds.  Abdominal:     General: Bowel sounds are normal.     Palpations: Abdomen is soft.  Musculoskeletal:     Cervical back: Normal range of motion.  Lymphadenopathy:     Cervical: No cervical adenopathy.  Skin:    General: Skin is warm and dry.  Neurological:     General: No focal deficit present.     Mental Status: She is alert and oriented to person, place, and time.  Psychiatric:        Mood and Affect: Mood normal.        Behavior: Behavior normal.     UC Treatments / Results  Labs (all labs ordered are listed, but only abnormal results are displayed) Labs Reviewed  CULTURE, GROUP A STREP [facility])  POCT RAPID STREP A (OFFICE)    EKG   Radiology No results found.  Procedures Procedures (including critical care time)  Medications Ordered in UC Medications - No data to display  Initial Impression / Assessment and Plan / UC Course  I have reviewed the triage vital signs and the nursing notes.  Pertinent labs & imaging results that were available during my care of the patient were reviewed by me and considered in my medical decision making (see chart for details).  Patient presents for complaints of sore throat and bilateral ear fullness.  On exam, patient has +2 tonsil swelling with exudate present.  Her rapid strep test was negative.  A throat culture is pending.  She has bilateral middle ear effusions present as well.  Discussion with patient regarding the use of medications to help with her symptoms.  Patient would like to start with fluticasone.  A prescription for prednisone has been ordered, patient will start the medication if the fluticasone does  not help her symptoms.  Patient was advised to pump and dump while she  is taking the steroids.  Patient was asked to follow-up with the pharmacist to ensure how long she should do this after completing the medication.  Supportive care recommendations were provided.  Patient advised to follow-up as needed. Final Clinical Impressions(s) / UC Diagnoses   Final diagnoses:  Sore throat  Eustachian tube disorder, bilateral     Discharge Instructions      Start using the fluticasone first.  If the fluticasone does not help your symptoms within the next 5 to 7 days, start the prednisone.  When you start the prednisone, do not breast-feed, recommend that you pump and dump for this duration.  Double check with the pharmacist to ensure how long you should do this. A throat culture is pending.  Your rapid strep test was negative.  If the throat culture is positive, you will be contacted and provided treatment. May take Tylenol as needed for any pain or fever. Follow-up if your symptoms worsen or do not improve.       ED Prescriptions     Medication Sig Dispense Auth. Provider   fluticasone (FLONASE) 50 MCG/ACT nasal spray Place 2 sprays into both nostrils daily. 16 g Aliany Fiorenza-Warren, Sadie Haber, NP   predniSONE (DELTASONE) 10 MG tablet Take 3 tablets (30 mg total) by mouth daily with breakfast for 5 days. 15 tablet Maksym Pfiffner-Warren, Sadie Haber, NP      PDMP not reviewed this encounter.   Abran Cantor, NP 04/05/22 1738

## 2022-04-05 NOTE — Discharge Instructions (Addendum)
Start using the fluticasone first.  If the fluticasone does not help your symptoms within the next 5 to 7 days, start the prednisone.  When you start the prednisone, do not breast-feed, recommend that you pump and dump for this duration.  Double check with the pharmacist to ensure how long you should do this. A throat culture is pending.  Your rapid strep test was negative.  If the throat culture is positive, you will be contacted and provided treatment. May take Tylenol as needed for any pain or fever. Follow-up if your symptoms worsen or do not improve.

## 2022-04-05 NOTE — ED Triage Notes (Signed)
Pt report bilateral ear fullness and sore throat x 1 week; left ear pain x 2 days. Pt has not talen any OTC meds for complaints. Pt is breastfeeding.

## 2022-04-07 DIAGNOSIS — J019 Acute sinusitis, unspecified: Secondary | ICD-10-CM | POA: Diagnosis not present

## 2022-04-07 DIAGNOSIS — J351 Hypertrophy of tonsils: Secondary | ICD-10-CM | POA: Diagnosis not present

## 2022-04-07 DIAGNOSIS — R093 Abnormal sputum: Secondary | ICD-10-CM | POA: Diagnosis not present

## 2022-04-07 DIAGNOSIS — H9202 Otalgia, left ear: Secondary | ICD-10-CM | POA: Diagnosis not present

## 2022-04-08 LAB — CULTURE, GROUP A STREP (THRC)

## 2023-01-24 DIAGNOSIS — N951 Menopausal and female climacteric states: Secondary | ICD-10-CM | POA: Diagnosis not present

## 2023-01-24 DIAGNOSIS — E063 Autoimmune thyroiditis: Secondary | ICD-10-CM | POA: Diagnosis not present

## 2023-01-24 DIAGNOSIS — Z131 Encounter for screening for diabetes mellitus: Secondary | ICD-10-CM | POA: Diagnosis not present

## 2023-01-24 DIAGNOSIS — Z13220 Encounter for screening for lipoid disorders: Secondary | ICD-10-CM | POA: Diagnosis not present

## 2023-01-31 DIAGNOSIS — R635 Abnormal weight gain: Secondary | ICD-10-CM | POA: Diagnosis not present

## 2023-01-31 DIAGNOSIS — Z1339 Encounter for screening examination for other mental health and behavioral disorders: Secondary | ICD-10-CM | POA: Diagnosis not present

## 2023-01-31 DIAGNOSIS — Z6826 Body mass index (BMI) 26.0-26.9, adult: Secondary | ICD-10-CM | POA: Diagnosis not present

## 2023-01-31 DIAGNOSIS — Z1331 Encounter for screening for depression: Secondary | ICD-10-CM | POA: Diagnosis not present

## 2023-01-31 DIAGNOSIS — N951 Menopausal and female climacteric states: Secondary | ICD-10-CM | POA: Diagnosis not present

## 2023-02-04 DIAGNOSIS — J020 Streptococcal pharyngitis: Secondary | ICD-10-CM | POA: Diagnosis not present

## 2024-05-06 ENCOUNTER — Encounter: Payer: Self-pay | Admitting: *Deleted

## 2024-05-06 ENCOUNTER — Ambulatory Visit (INDEPENDENT_AMBULATORY_CARE_PROVIDER_SITE_OTHER): Admitting: *Deleted

## 2024-05-06 VITALS — BP 128/79 | HR 80

## 2024-05-06 DIAGNOSIS — Z3201 Encounter for pregnancy test, result positive: Secondary | ICD-10-CM

## 2024-05-06 LAB — POCT URINE PREGNANCY: Preg Test, Ur: POSITIVE — AB

## 2024-05-06 NOTE — Progress Notes (Signed)
   NURSE VISIT- PREGNANCY CONFIRMATION   SUBJECTIVE:  Melinda Wright is a 28 y.o. G74P1001 female at [redacted]w[redacted]d by certain LMP of Patient's last menstrual period was 04/03/2024. Here for pregnancy confirmation.  Home pregnancy test: positive x 4  She reports no complaints.  She is taking prenatal vitamins.    OBJECTIVE:  BP 128/79 (BP Location: Right Arm, Patient Position: Sitting, Cuff Size: Normal)   Pulse 80   LMP 04/03/2024   Breastfeeding Yes   Appears well, in no apparent distress  No results found for this or any previous visit (from the past 24 hours).  ASSESSMENT: Positive pregnancy test, [redacted]w[redacted]d by LMP    PLAN: Schedule for dating ultrasound in 3 weeks Prenatal vitamins: continue   Nausea medicines: not currently needed   OB packet given: Yes  Alan LITTIE Fischer  05/06/2024 4:02 PM

## 2024-05-09 ENCOUNTER — Ambulatory Visit
Admission: RE | Admit: 2024-05-09 | Discharge: 2024-05-09 | Disposition: A | Discharge status: Home / Self Care | Source: Ambulatory Visit | Attending: Nurse Practitioner

## 2024-05-09 VITALS — BP 124/77 | HR 80 | Temp 97.7°F | Resp 18

## 2024-05-09 DIAGNOSIS — H6122 Impacted cerumen, left ear: Secondary | ICD-10-CM

## 2024-05-09 DIAGNOSIS — H6591 Unspecified nonsuppurative otitis media, right ear: Secondary | ICD-10-CM

## 2024-05-09 MED ORDER — FLUTICASONE PROPIONATE 50 MCG/ACT NA SUSP: 2.0000 | Freq: Every day | NASAL | 0 refills | Status: AC

## 2024-05-09 NOTE — Discharge Instructions (Signed)
 Take medication as prescribed.  You may take over-the-counter Benadryl  (alcohol free) as needed. You may take over-the-counter Tylenol  as needed for pain, fever, or general discomfort. Avoid sticking anything or inserting anything inside of the ear while symptoms persist. Recommend using over-the-counter earwax softener such as Debrox at least 2-3 times weekly to prevent cerumen impaction. You may follow-up in this clinic or with your primary care physician if symptoms fail to improve. Follow-up as needed.

## 2024-05-09 NOTE — ED Provider Notes (Signed)
 [facility]    CSN: [id number] Arrival date & time: 05/09/24  1254      History   Chief Complaint Chief Complaint  Patient presents with   Ear Fullness    Severe congestion - Entered by patient    HPI Melinda Wright is a 28 y.o. female.   The history is provided by the patient.   Patient presents with a 1 month history of bilateral ear fullness.  Patient also reports decreased hearing from the left ear.  Patient also endorses nasal congestion that started over the past 1 to 2 days.  She denies fever, chills, headache, ear pain, ear drainage, nasal congestion, runny nose, or cough.  Patient states that her mother did flush the left ear over the past few days with minimal relief of symptoms.  Patient reports she is approximately [redacted] weeks pregnant.  Past Medical History:  Diagnosis Date   Foot fracture 2012   left metatarsal fracture   Medical history non-contributory     Patient Active Problem List   Diagnosis Date Noted   H/O Preeclampsia 09/30/2021    Past Surgical History:  Procedure Laterality Date   NO PAST SURGERIES      OB History     Gravida  2   Para  1   Term  1   Preterm      AB      Living  1      SAB      IAB      Ectopic      Multiple  0   Live Births  1            Home Medications    Prior to Admission medications   Medication Sig Start Date End Date Taking? Authorizing Provider  fluticasone  (FLONASE ) 50 MCG/ACT nasal spray Place 2 sprays into both nostrils daily. 05/09/24  Yes Leath-Warren, Etta PARAS, NP  Pediatric Multiple Vitamins (FLINSTONES GUMMIES OMEGA-3 DHA PO) Take by mouth. Takes 2 daily    [provider]    Family History Family History  Problem Relation Age of Onset   COPD Paternal Grandfather    Thyroid disease Maternal Grandmother     Social History Social History   Tobacco Use   Smoking status: Never   Smokeless tobacco: Never  Vaping Use   Vaping status: Never Used   Substance Use Topics   Alcohol use: Not Currently   Drug use: Never     Allergies   Patient has no known allergies.   Review of Systems Review of Systems Per HPI  Physical Exam Triage Vital Signs ED Triage Vitals  Encounter Vitals Group     BP 05/09/24 1301 124/77     Girls Systolic BP Percentile --      Girls Diastolic BP Percentile --      Boys Systolic BP Percentile --      Boys Diastolic BP Percentile --      Pulse Rate 05/09/24 1301 80     Resp 05/09/24 1301 18     Temp 05/09/24 1301 97.7 F (36.5 C)     Temp Source 05/09/24 1301 Oral     SpO2 05/09/24 1301 98 %     Weight --      Height --      Head Circumference --      Peak Flow --      Pain Score 05/09/24 1302 0     Pain Loc --  Pain Education --      Exclude from Growth Chart --    No data found.  Updated Vital Signs BP 124/77 (BP Location: Right Arm)   Pulse 80   Temp 97.7 F (36.5 C) (Oral)   Resp 18   LMP 04/03/2024   SpO2 98%   Visual Acuity Right Eye Distance:   Left Eye Distance:   Bilateral Distance:    Right Eye Near:   Left Eye Near:    Bilateral Near:     Physical Exam Vitals and nursing note reviewed.  Constitutional:      General: She is not in acute distress.    Appearance: Normal appearance.  HENT:     Head: Normocephalic.     Right Ear: Ear canal and external ear normal.     Left Ear: Ear canal and external ear normal. There is impacted cerumen.     Ears:     Comments: Post ear irrigation of the left ear, complete evacuation of cerumen impaction obtained.  Patient reports good improvement of symptoms including her hearing.    Nose: Nose normal.     Mouth/Throat:     Mouth: Mucous membranes are moist.  Eyes:     Extraocular Movements: Extraocular movements intact.     Conjunctiva/sclera: Conjunctivae normal.     Pupils: Pupils are equal, round, and reactive to light.  Cardiovascular:     Rate and Rhythm: Normal rate and regular rhythm.     Pulses: Normal  pulses.     Heart sounds: Normal heart sounds.  Pulmonary:     Effort: Pulmonary effort is normal. No respiratory distress.     Breath sounds: Normal breath sounds. No stridor. No wheezing, rhonchi or rales.  Abdominal:     General: Bowel sounds are normal.     Palpations: Abdomen is soft.     Tenderness: There is no abdominal tenderness.  Musculoskeletal:     Cervical back: Normal range of motion.  Skin:    General: Skin is warm and dry.  Neurological:     General: No focal deficit present.     Mental Status: She is alert and oriented to person, place, and time.  Psychiatric:        Mood and Affect: Mood normal.        Behavior: Behavior normal.      UC Treatments / Results  Labs (all labs ordered are listed, but only abnormal results are displayed) Labs Reviewed - No data to display  EKG   Radiology No results found.  Procedures Procedures (including critical care time)  Medications Ordered in UC Medications - No data to display  Initial Impression / Assessment and Plan / UC Course  I have reviewed the triage vital signs and the nursing notes.  Pertinent labs & imaging results that were available during my care of the patient were reviewed by me and considered in my medical decision making (see chart for details).  Patient with cerumen impaction of the left ear and right middle ear effusion.  Cerumen impaction with good solution after ear irrigation.  Will treat right ear effusion with fluticasone  50 mcg nasal spray.  Supportive care recommendations were provided discussed with the patient to include over-the-counter antihistamine such as Benadryl  (alcohol free), and use of Debrox or other over-the-counter earwax softeners, warm compresses to the ear, and over-the-counter analgesics such as Tylenol .  Discussed indications with patient regarding follow-up.  Patient was in agreement with this plan of care and verbalizes  understanding.  All questions were answered.   Patient stable for discharge.  Final Clinical Impressions(s) / UC Diagnoses   Final diagnoses:  Impacted cerumen of left ear  Middle ear effusion, right     Discharge Instructions      Take medication as prescribed.  You may take over-the-counter Benadryl  (alcohol free) as needed. You may take over-the-counter Tylenol  as needed for pain, fever, or general discomfort. Avoid sticking anything or inserting anything inside of the ear while symptoms persist. Recommend using over-the-counter earwax softener such as Debrox at least 2-3 times weekly to prevent cerumen impaction. You may follow-up in this clinic or with your primary care physician if symptoms fail to improve. Follow-up as needed.     ED Prescriptions     Medication Sig Dispense Auth. Provider   fluticasone  (FLONASE ) 50 MCG/ACT nasal spray Place 2 sprays into both nostrils daily. 16 g Leath-Warren, Etta PARAS, NP      PDMP not reviewed this encounter.   Gilmer Etta PARAS, NP 05/09/24 1339

## 2024-05-09 NOTE — ED Triage Notes (Signed)
 Bilateral ears feel stopped up over 1 month.  Having nasal congestion now.

## 2024-06-01 ENCOUNTER — Other Ambulatory Visit: Payer: Self-pay | Admitting: Obstetrics & Gynecology

## 2024-06-01 DIAGNOSIS — O3680X0 Pregnancy with inconclusive fetal viability, not applicable or unspecified: Secondary | ICD-10-CM

## 2024-06-02 ENCOUNTER — Other Ambulatory Visit

## 2024-06-02 DIAGNOSIS — Z3A08 8 weeks gestation of pregnancy: Secondary | ICD-10-CM

## 2024-06-02 DIAGNOSIS — O3680X0 Pregnancy with inconclusive fetal viability, not applicable or unspecified: Secondary | ICD-10-CM

## 2024-06-02 DIAGNOSIS — Z3491 Encounter for supervision of normal pregnancy, unspecified, first trimester: Secondary | ICD-10-CM

## 2024-06-02 NOTE — Progress Notes (Signed)
 US  8+4 wks single IUP with yolk sac,FHR 162 bpm,normal right ovary,simple left corpus luteal cyst 2.8 x 2.4 x 2 cm

## 2024-06-24 ENCOUNTER — Inpatient Hospital Stay (HOSPITAL_COMMUNITY)
Admission: AD | Admit: 2024-06-24 | Discharge: 2024-06-25 | Disposition: A | Discharge status: Home / Self Care | Attending: Obstetrics and Gynecology | Admitting: Obstetrics and Gynecology

## 2024-06-24 ENCOUNTER — Ambulatory Visit (INDEPENDENT_AMBULATORY_CARE_PROVIDER_SITE_OTHER): Admitting: *Deleted

## 2024-06-24 ENCOUNTER — Encounter (HOSPITAL_COMMUNITY): Payer: Self-pay | Admitting: Obstetrics and Gynecology

## 2024-06-24 ENCOUNTER — Encounter: Payer: Self-pay | Admitting: Women's Health

## 2024-06-24 VITALS — BP 118/77 | HR 124 | Temp 100.2°F

## 2024-06-24 DIAGNOSIS — O99511 Diseases of the respiratory system complicating pregnancy, first trimester: Secondary | ICD-10-CM | POA: Insufficient documentation

## 2024-06-24 DIAGNOSIS — J00 Acute nasopharyngitis [common cold]: Secondary | ICD-10-CM | POA: Diagnosis not present

## 2024-06-24 DIAGNOSIS — R109 Unspecified abdominal pain: Secondary | ICD-10-CM | POA: Diagnosis not present

## 2024-06-24 DIAGNOSIS — R52 Pain, unspecified: Secondary | ICD-10-CM

## 2024-06-24 DIAGNOSIS — R5381 Other malaise: Secondary | ICD-10-CM

## 2024-06-24 DIAGNOSIS — O26891 Other specified pregnancy related conditions, first trimester: Secondary | ICD-10-CM | POA: Diagnosis not present

## 2024-06-24 DIAGNOSIS — R0602 Shortness of breath: Secondary | ICD-10-CM | POA: Insufficient documentation

## 2024-06-24 DIAGNOSIS — Z3A11 11 weeks gestation of pregnancy: Secondary | ICD-10-CM | POA: Diagnosis not present

## 2024-06-24 DIAGNOSIS — R6883 Chills (without fever): Secondary | ICD-10-CM

## 2024-06-24 LAB — POCT URINALYSIS DIPSTICK OB
Blood, UA: NEGATIVE
Glucose, UA: NEGATIVE
Ketones, UA: NEGATIVE
Leukocytes, UA: NEGATIVE
Nitrite, UA: NEGATIVE
POC,PROTEIN,UA: NEGATIVE

## 2024-06-24 LAB — CBC
HCT: 37.2 % (ref 36.0–46.0)
Hemoglobin: 12.4 g/dL (ref 12.0–15.0)
MCH: 28.8 pg (ref 26.0–34.0)
MCHC: 33.3 g/dL (ref 30.0–36.0)
MCV: 86.3 fL (ref 80.0–100.0)
Platelets: 251 K/uL (ref 150–400)
RBC: 4.31 MIL/uL (ref 3.87–5.11)
RDW: 13.1 % (ref 11.5–15.5)
WBC: 8.4 K/uL (ref 4.0–10.5)
nRBC: 0 % (ref 0.0–0.2)

## 2024-06-24 MED ORDER — ACETAMINOPHEN 500 MG PO TABS
1000.0000 mg | ORAL_TABLET | Freq: Once | ORAL | Status: AC
  Administered 2024-06-24: 1000 mg via ORAL
  Filled 2024-06-24: qty 2

## 2024-06-24 MED ORDER — CYCLOBENZAPRINE HCL 5 MG PO TABS
5.0000 mg | ORAL_TABLET | Freq: Three times a day (TID) | ORAL | Status: DC | PRN
  Administered 2024-06-24: 5 mg via ORAL
  Filled 2024-06-24: qty 1

## 2024-06-24 NOTE — Progress Notes (Signed)
   NURSE VISIT- UTI SYMPTOMS   SUBJECTIVE:  Melinda Wright is a 28 y.o. G48P1001 female here for UTI symptoms. She is [redacted]w[redacted]d pregnant. She reports chills, flank pain on the right, and body aches. States the flank pain started yesterday and has continued to linger through to today. States she does not feel well and has experienced some SOB when talking. Notes she has been sneezing more frequently over the last 2 days as well. Denies any other symptoms.   OBJECTIVE:  BP 118/77   Pulse (!) 124   Temp 100.2 F (37.9 C)   LMP 04/03/2024   Appears as though she does not feel well  Results for orders placed or performed in visit on 06/24/24 (from the past 24 hours)  POC Urinalysis Dipstick OB   Collection Time: 06/24/24  2:36 PM  Result Value Ref Range   Color, UA     Clarity, UA     Glucose, UA Negative Negative   Bilirubin, UA     Ketones, UA neg    Spec Grav, UA     Blood, UA neg    pH, UA     POC,PROTEIN,UA Negative Negative, Trace, Small (1+), Moderate (2+), Large (3+), 4+   Urobilinogen, UA     Nitrite, UA neg    Leukocytes, UA Negative Negative   Appearance     Odor      ASSESSMENT: Pregnancy [redacted]w[redacted]d with UTI symptoms and negative nitrites UTI vs respiratory illness  PLAN: Discussed with Dr. Ozan   Rx sent by provider today: no Urine culture sent Call or return to clinic prn if these symptoms worsen or fail to improve as anticipated. Advised she could go home to rest, push fluids and use a heating pad to right side and monitor for worsening symptoms or could go to MAU for evaluation.   Follow-up: as scheduled   Melinda Wright  06/24/2024 2:47 PM

## 2024-06-24 NOTE — Progress Notes (Deleted)
   NURSE VISIT- UTI SYMPTOMS   SUBJECTIVE:  Melinda Wright is a 28 y.o. G93P1001 female here for UTI symptoms. She is [redacted]w[redacted]d pregnant. She reports {Symptoms; LUP:80621}.  OBJECTIVE:  LMP 04/03/2024   Appears well, in no apparent distress  No results found for this or any previous visit (from the past 24 hours).  ASSESSMENT: {Blank single:19197::Pregnancy [redacted]w[redacted]d with UTI symptoms,Postpartum with UTI symptoms,GYN patient with UTI symptoms} and {gen pos wzh:684356} nitrites  PLAN: {Blank single:19197::Discussed with,Note routed to} {Blank single:19197::Dr. Eure,Dr. Marilynn Six. Ervin,Dr. Eldonna Luke Fetters, CNM, WHNP,Jennifer River[place], AGNP,Fran Perpetua Elling-Dishmon, CNM,Kim Frisbee, CNM}   Rx sent by provider today: {yes/no:20286} Urine culture {SENT/NOT SENT:21340::sent} Call or return to clinic prn if these symptoms worsen or fail to improve as anticipated. Follow-up: {Blank single:19197::as scheduled,as needed}   Rutherford Rover  06/24/2024 1:56 PM

## 2024-06-24 NOTE — MAU Note (Signed)
 MAU Triage Note:  .Melinda Wright is a 28 y.o. at [redacted]w[redacted]d here in MAU reporting: woke up with right lower back pain. Later in the morning she started having right lower back pain, chills, nausea, headache, fever, and SOB. The pain is worse with movement and feels like a sharp cramp. Went to her doctor and urine was negative for infection. Last took 1000 mg tylenol  at 2000 for fever and pain. Denies VB or LOF.  Patient complaint: Lower back pain on right side, fever, vomitting  Pain Score: 5  Pain Location: Back     Onset of complaint: today LMP: Patient's last menstrual period was 04/03/2024.  Vitals:   06/24/24 2233  BP: 126/79  Pulse: (!) 119  Resp: 20  Temp: 99.5 F (37.5 C)  SpO2: 100%    FHT:  Fetal Heart Rate Mode: Doppler Baseline Rate (A): 166 bpm Lab orders placed from triage: UA

## 2024-06-24 NOTE — MAU Provider Note (Signed)
 Chief Complaint: Back Pain, Tachycardia, Chills, Fever, and Emesis  SUBJECTIVE HPI: Melinda Wright is a 28 y.o. G2P1001 at [redacted]w[redacted]d by LMP who presents to maternity admissions reporting chills, flank pain on the right, and body aches. States the flank pain started yesterday and has continued to linger through to today. States she does not feel well and has experienced some SOB when talking. Notes she has been sneezing more frequently over the last 2 days as well. Denies any other symptoms.  Of note UA in the office today negative.  She denies vaginal bleeding, vaginal itching/burning, h/a, dizziness, n/v, or fever/chills.    HPI  Past Medical History:  Diagnosis Date   Foot fracture 2012   left metatarsal fracture   Medical history non-contributory    Past Surgical History:  Procedure Laterality Date   NO PAST SURGERIES     Social History   Socioeconomic History   Marital status: Married    Spouse name: Not on file   Number of children: Not on file   Years of education: Not on file   Highest education level: Not on file  Occupational History   Not on file  Tobacco Use   Smoking status: Never   Smokeless tobacco: Never  Vaping Use   Vaping status: Never Used  Substance and Sexual Activity   Alcohol use: Not Currently   Drug use: Never   Sexual activity: Yes    Birth control/protection: None  Other Topics Concern   Not on file  Social History Narrative   Not on file   Social Drivers of Health   Financial Resource Strain: Low Risk  (11/13/2021)   Overall Financial Resource Strain (CARDIA)    Difficulty of Paying Living Expenses: Not hard at all  Food Insecurity: No Food Insecurity (11/13/2021)   Hunger Vital Sign    Worried About Running Out of Food in the Last Year: Never true    Ran Out of Food in the Last Year: Never true  Transportation Needs: No Transportation Needs (11/13/2021)   PRAPARE - Administrator, Civil Service (Medical): No    Lack of  Transportation (Non-Medical): No  Physical Activity: Unknown (11/13/2021)   Exercise Vital Sign    Days of Exercise per Week: Not on file    Minutes of Exercise per Session: 50 min  Stress: No Stress Concern Present (11/13/2021)   [employer] of Occupational Health - Occupational Stress Questionnaire    Feeling of Stress : Not at all  Social Connections: Moderately Integrated (11/13/2021)   Social Connection and Isolation Panel    Frequency of Communication with Friends and Family: More than three times a week    Frequency of Social Gatherings with Friends and Family: Twice a week    Attends Religious Services: 1 to 4 times per year    Active Member of [employer] or Organizations: No    Attends Banker Meetings: Never    Marital Status: Married  Catering manager Violence: Not At Risk (11/13/2021)   Humiliation, Afraid, Rape, and Kick questionnaire    Fear of Current or Ex-Partner: No    Emotionally Abused: No    Physically Abused: No    Sexually Abused: No   No current facility-administered medications on file prior to encounter.   Current Outpatient Medications on File Prior to Encounter  Medication Sig Dispense Refill   fluticasone  (FLONASE ) 50 MCG/ACT nasal spray Place 2 sprays into both nostrils daily. 16 g 0   Pediatric  Multiple Vitamins (FLINSTONES GUMMIES OMEGA-3 DHA PO) Take by mouth. Takes 2 daily     No Known Allergies  ROS:  Pertinent positives/negatives listed above.  I have reviewed patient's Past Medical Hx, Surgical Hx, Family Hx, Social Hx, medications and allergies.   Physical Exam  No data found. Constitutional: Well-developed, well-nourished female in no acute distress.  Cardiovascular: normal rate Respiratory: normal effort GI: Abd soft, non-tender. Pos BS x 4 MS: Extremities nontender, no edema, normal ROM Neurologic: Alert and oriented x 4.  GU: Neg CVAT.  Doppler: 166  LAB RESULTS Results for orders placed or performed in visit on  06/24/24 (from the past 24 hours)  POC Urinalysis Dipstick OB     Status: None   Collection Time: 06/24/24  2:36 PM  Result Value Ref Range   Color, UA     Clarity, UA     Glucose, UA Negative Negative   Bilirubin, UA     Ketones, UA neg    Spec Grav, UA     Blood, UA neg    pH, UA     POC,PROTEIN,UA Negative Negative, Trace, Small (1+), Moderate (2+), Large (3+), 4+   Urobilinogen, UA     Nitrite, UA neg    Leukocytes, UA Negative Negative   Appearance     Odor         IMAGING US  OB Comp Less 14 Wks Result Date: 06/16/2024 Table formatting from the original result was not included. Images from the original result were not included.  ..an [employer] of Ultrasound Medicine Technical sales engineer) accredited practice Center for [facility] [address] [place] [address] Ordering Provider: Ozan, Jennifer, DO                                                                                                                             DATING AND VIABILITY SONOGRAM Melinda Wright is a 28 y.o. year old G2P1001  Patient's last menstrual period was 04/03/2024. which would correlate to  [redacted]w[redacted]d weeks gestation.  She has regular menstrual cycles.   She is here today for a confirmatory initial sonogram. GESTATION: SINGLETON   FETAL ACTIVITY:          Heart rate         162          CERVIX: Appears closed ADNEXA: The ovaries are normal. simple left corpus luteal cyst 2.8 x 2.4 x 2 cm GESTATIONAL AGE AND  BIOMETRICS: Gestational criteria: Estimated Date of Delivery: 01/08/25 by LMP now at [redacted]w[redacted]d Previous Scans:0    CROWN RUMP LENGTH           18.60 mm         8+3 weeks  AVERAGE EGA(BY THIS SCAN):  8+3 weeks WORKING EDD( LMP ):  01/08/2025  TECHNICIAN COMMENTS: US  8+4 wks single IUP with yolk sac,FHR 162 bpm,normal right ovary,simple left corpus luteal cyst 2.8 x 2.4 x 2 cm A copy of this report including all images has been  saved and backed up to a second source for retrieval if needed. All measures and details of the anatomical scan, placentation, fluid volume and pelvic anatomy are contained in that report. Melinda Wright 06/02/2024 4:59 PM Clinical Impression and recommendations: I have reviewed the sonogram results above, combined with the patient's current clinical course, below are my impressions and any appropriate recommendations for management based on the sonographic findings. Viable early IUP G2P1001 Estimated Date of Delivery: 01/08/25 LMP, today's scan Left corpus luteum of pregnancy Normal general sonographic findings Recommend routine care unless otherwise clinically indicated Melinda Wright 06/16/2024 6:29 AM   MAU Management/MDM: Orders Placed This Encounter  Procedures   Resp panel by RT-PCR (RSV, Flu A&B, Covid) Anterior Nasal Swab   CBC   Comprehensive metabolic panel   Lipase, blood    No orders of the defined types were placed in this encounter.  ASSESSMENT 1. [redacted] weeks gestation of pregnancy   2. Malaise     PLAN Discharge home with strict return precautions. Allergies as of 06/24/2024   No Known Allergies   Med Rec must be completed prior to using this SMARTLINK***        Melinda Shropshire, MD FMOB Fellow, Faculty practice [facility], [facility]  06/24/2024  10:33 PM

## 2024-06-25 ENCOUNTER — Ambulatory Visit: Payer: Self-pay | Admitting: Obstetrics & Gynecology

## 2024-06-25 DIAGNOSIS — R109 Unspecified abdominal pain: Secondary | ICD-10-CM | POA: Diagnosis not present

## 2024-06-25 DIAGNOSIS — O26891 Other specified pregnancy related conditions, first trimester: Secondary | ICD-10-CM | POA: Diagnosis not present

## 2024-06-25 DIAGNOSIS — J00 Acute nasopharyngitis [common cold]: Secondary | ICD-10-CM | POA: Diagnosis not present

## 2024-06-25 DIAGNOSIS — R6883 Chills (without fever): Secondary | ICD-10-CM | POA: Diagnosis not present

## 2024-06-25 DIAGNOSIS — Z3A11 11 weeks gestation of pregnancy: Secondary | ICD-10-CM

## 2024-06-25 DIAGNOSIS — R52 Pain, unspecified: Secondary | ICD-10-CM | POA: Diagnosis not present

## 2024-06-25 LAB — RESP PANEL BY RT-PCR (RSV, FLU A&B, COVID)  RVPGX2
Influenza A by PCR: NEGATIVE
Influenza B by PCR: NEGATIVE
Resp Syncytial Virus by PCR: NEGATIVE
SARS Coronavirus 2 by RT PCR: NEGATIVE

## 2024-06-25 LAB — COMPREHENSIVE METABOLIC PANEL WITH GFR
ALT: 16 U/L (ref 0–44)
AST: 26 U/L (ref 15–41)
Albumin: 3.6 g/dL (ref 3.5–5.0)
Alkaline Phosphatase: 51 U/L (ref 38–126)
Anion gap: 9 (ref 5–15)
BUN: <5 mg/dL — ABNORMAL LOW (ref 6–20)
CO2: 21 mmol/L — ABNORMAL LOW (ref 22–32)
Calcium: 8.4 mg/dL — ABNORMAL LOW (ref 8.9–10.3)
Chloride: 105 mmol/L (ref 98–111)
Creatinine, Ser: 0.48 mg/dL (ref 0.44–1.00)
GFR, Estimated: >60 mL/min (ref >60)
Glucose, Bld: 119 mg/dL — ABNORMAL HIGH (ref 70–99)
Potassium: 3.8 mmol/L (ref 3.5–5.1)
Sodium: 135 mmol/L (ref 135–145)
Total Bilirubin: 0.5 mg/dL (ref 0.0–1.2)
Total Protein: 7.3 g/dL (ref 6.5–8.1)

## 2024-06-25 LAB — LIPASE, BLOOD: Lipase: 35 U/L (ref 11–51)

## 2024-06-29 ENCOUNTER — Other Ambulatory Visit: Payer: Self-pay | Admitting: Obstetrics & Gynecology

## 2024-06-29 DIAGNOSIS — Z3490 Encounter for supervision of normal pregnancy, unspecified, unspecified trimester: Secondary | ICD-10-CM | POA: Insufficient documentation

## 2024-06-29 DIAGNOSIS — N3000 Acute cystitis without hematuria: Secondary | ICD-10-CM

## 2024-06-29 DIAGNOSIS — Z3480 Encounter for supervision of other normal pregnancy, unspecified trimester: Secondary | ICD-10-CM | POA: Insufficient documentation

## 2024-06-29 LAB — URINE CULTURE

## 2024-06-29 MED ORDER — AMOXICILLIN 500 MG PO CAPS
500.0000 mg | ORAL_CAPSULE | Freq: Three times a day (TID) | ORAL | 0 refills | Status: AC
Start: 2024-06-29 — End: 2024-07-04

## 2024-06-29 NOTE — Progress Notes (Signed)
Rx for UTI  Janyth Pupa, DO Attending [place], [facility] for [facility], [place]

## 2024-06-30 ENCOUNTER — Encounter: Payer: Self-pay | Admitting: Women's Health

## 2024-06-30 ENCOUNTER — Other Ambulatory Visit (HOSPITAL_COMMUNITY)
Admission: RE | Admit: 2024-06-30 | Discharge: 2024-06-30 | Disposition: A | Discharge status: Home / Self Care | Source: Ambulatory Visit | Attending: Women's Health | Admitting: Women's Health

## 2024-06-30 ENCOUNTER — Ambulatory Visit: Admitting: Women's Health

## 2024-06-30 ENCOUNTER — Ambulatory Visit: Admitting: *Deleted

## 2024-06-30 VITALS — BP 114/74 | HR 86 | Wt 164.0 lb

## 2024-06-30 DIAGNOSIS — Z1332 Encounter for screening for maternal depression: Secondary | ICD-10-CM | POA: Diagnosis not present

## 2024-06-30 DIAGNOSIS — Z131 Encounter for screening for diabetes mellitus: Secondary | ICD-10-CM

## 2024-06-30 DIAGNOSIS — Z3480 Encounter for supervision of other normal pregnancy, unspecified trimester: Secondary | ICD-10-CM | POA: Diagnosis not present

## 2024-06-30 DIAGNOSIS — Z3A12 12 weeks gestation of pregnancy: Secondary | ICD-10-CM | POA: Diagnosis not present

## 2024-06-30 DIAGNOSIS — O2341 Unspecified infection of urinary tract in pregnancy, first trimester: Secondary | ICD-10-CM | POA: Diagnosis not present

## 2024-06-30 DIAGNOSIS — Z3481 Encounter for supervision of other normal pregnancy, first trimester: Secondary | ICD-10-CM

## 2024-06-30 MED ORDER — BLOOD PRESSURE MONITOR MISC: 0 refills | Status: AC

## 2024-06-30 MED ORDER — ASPIRIN 81 MG PO TBEC: 162.0000 mg | DELAYED_RELEASE_TABLET | Freq: Every day | ORAL | 2 refills | Status: AC

## 2024-06-30 NOTE — Progress Notes (Signed)
 INITIAL OBSTETRICAL VISIT Patient name: Melinda Wright MRN [id number]  Date of birth: 10/11/1996 Chief Complaint:   Initial Prenatal Visit  History of Present Illness:   Melinda Wright is a 28 y.o. G48P1001 Caucasian female at [redacted]w[redacted]d by LMP c/w u/s at 8 weeks with an Estimated Date of Delivery: 01/08/25 being seen today for her initial obstetrical visit.   Patient's last menstrual period was 04/03/2024. Her obstetrical history is significant for term SVB w/ pre-e.   Today she reports n/v much improved, UTI- started meds yesterday, sx much improved already.  Last pap 2022. Results were: NILM w/ HRHPV not done     06/30/2024    3:21 PM 06/30/2021    8:49 AM 04/04/2021    3:31 PM 02/16/2021   11:10 AM  Depression screen PHQ 2/9  Decreased Interest 0 0 0 0  Down, Depressed, Hopeless 0 0 0 0  PHQ - 2 Score 0 0 0 0  Altered sleeping 0 1 1 2   Tired, decreased energy 0 1 0 2  Change in appetite 0 0 0 0  Feeling bad or failure about yourself  0 0 0 0  Trouble concentrating 0 0 0 0  Moving slowly or fidgety/restless 0 0 0 0  Suicidal thoughts 0 0 0 0  PHQ-9 Score 0 2 1 4         06/30/2024    3:21 PM 06/30/2021    8:50 AM 04/04/2021    3:31 PM 02/16/2021   11:12 AM  GAD 7 : Generalized Anxiety Score  Nervous, Anxious, on Edge 0 0 0 0  Control/stop worrying 0 0 0 0  Worry too much - different things 0 0 0 0  Trouble relaxing 0 0 0 0  Restless 0 0 0 0  Easily annoyed or irritable 0 0 0 0  Afraid - awful might happen 0 0 0 0  Total GAD 7 Score 0 0 0 0     Review of Systems:   Pertinent items are noted in HPI Denies cramping/contractions, leakage of fluid, vaginal bleeding, abnormal vaginal discharge w/ itching/odor/irritation, headaches, visual changes, shortness of breath, chest pain, abdominal pain, severe nausea/vomiting, or problems with urination or bowel movements unless otherwise stated above.  Pertinent History Reviewed:  Reviewed past medical,surgical, social, obstetrical  and family history.  Reviewed problem list, medications and allergies. OB History  Gravida Para Term Preterm AB Living  2 1 1   1   SAB IAB Ectopic Multiple Live Births     0 1    # Outcome Date GA Lbr Len/2nd Weight Sex Type Anes PTL Lv  2 Current           1 Term 09/29/21 [redacted]w[redacted]d 11:29 / 01:21 7 lb 9 oz (3.43 kg) M Vag-Spont EPI, Local  LIV   Physical Assessment:   Vitals:   06/30/24 1518  BP: 114/74  Pulse: 86  Weight: 164 lb (74.4 kg)  Body mass index is 27.29 kg/m.       Physical Examination:  General appearance - well appearing, and in no distress  Mental status - alert, oriented to person, place, and time  Psych:  She has a normal mood and affect  Skin - warm and dry, normal color, no suspicious lesions noted  Chest - effort normal, all lung fields clear to auscultation bilaterally  Heart - normal rate and regular rhythm  Abdomen - soft, nontender  Extremities:  No swelling or varicosities noted  Pelvic -  VULVA: normal appearing vulva with no masses, tenderness or lesions  VAGINA: normal appearing vagina with normal color and discharge, no lesions  CERVIX: normal appearing cervix without discharge or lesions, no CMT  Thin prep pap is done w/ reflex HR HPV cotesting  Chaperone: Aleck Blase  TODAY'S informal TA u/s: +FCA and active fetus  No results found for this or any previous visit (from the past 24 hours).  Assessment & Plan:  1) Low-Risk Pregnancy G2P1001 at [redacted]w[redacted]d with an Estimated Date of Delivery: 01/08/25   2) Initial OB visit  3) H/O pre-e> ASA, baseline labs (minus P:C) today, will get P:C next visit after UTI clears  4) UTI> continue amoxcillin, urine cx next visit  Meds:  Meds ordered this encounter  Medications   Blood Pressure Monitor MISC    Sig: For regular home bp monitoring during pregnancy    Dispense:  1 each    Refill:  0    O09.91 Please mail to patient   aspirin  EC 81 MG tablet    Sig: Take 2 tablets (162 mg total) by mouth daily.  Swallow whole.    Dispense:  180 tablet    Refill:  2    Initial labs obtained Continue prenatal vitamins Reviewed n/v relief measures and warning s/s to report Reviewed recommended weight gain based on pre-gravid BMI Encouraged well-balanced diet Genetic & carrier screening discussed: requests Panorama and AFP, declines Horizon  Ultrasound discussed; fetal survey: requested CCNC completed> form faxed if has or is planning to apply for medicaid The nature of [facility] [facility] with multiple MDs and other Advanced Practice Providers was explained to patient; also emphasized that fellows, residents, and students are part of our team. Does have home bp cuff. Office bp cuff given: no. Rx sent: n/a. Check bp weekly, let us  know if consistently >140/90.   Follow-up: Return in about 4 weeks (around 07/28/2024) for LROB, AFP, CNM in person; 20wks for anatomy u/s and LROB .   Orders Placed This Encounter  Procedures   CBC/D/Plt+RPR+Rh+ABO+RubIgG...   Hemoglobin A1c   PANORAMA PRENATAL TEST    Suzen JONELLE Fetters CNM, [facility] 06/30/2024 3:52 PM

## 2024-06-30 NOTE — Patient Instructions (Signed)
Melinda Wright, thank you for choosing our office today! We appreciate the opportunity to meet your healthcare needs. You may receive a short survey by mail, e-mail, or through MyChart. If you are happy with your care we would appreciate if you could take just a few minutes to complete the survey questions. We read all of your comments and take your feedback very seriously. Thank you again for choosing our office.  [facility] ([address] [place], [place] [address]) Entrance C, located off of [address] Free 24/7 valet parking   Nausea & Vomiting Have saltine crackers or pretzels by your bed and eat a few bites before you raise your head out of bed in the morning Eat small frequent meals throughout the day instead of large meals Drink plenty of fluids throughout the day to stay hydrated, just don't drink a lot of fluids with your meals.  This can make your stomach fill up faster making you feel sick Do not brush your teeth right after you eat Products with real ginger are good for nausea, like ginger ale and ginger hard candy Make sure it says made with real ginger! Sucking on sour candy like lemon heads is also good for nausea If your prenatal vitamins make you nauseated, take them at night so you will sleep through the nausea Sea Bands If you feel like you need medicine for the nausea & vomiting please let us know If you are unable to keep any fluids or food down please let us know   Constipation Drink plenty of fluid, preferably water, throughout the day Eat foods high in fiber such as fruits, vegetables, and grains Exercise, such as walking, is a good way to keep your bowels regular Drink warm fluids, especially warm prune juice, or decaf coffee Eat a 1/2 cup of real oatmeal (not instant), 1/2 cup applesauce, and 1/2-1 cup warm prune juice every day If needed, you may take Colace (docusate sodium) stool softener  once or twice a day to help keep the stool soft.  If you still are having problems with constipation, you may take Miralax once daily as needed to help keep your bowels regular.   Home Blood Pressure Monitoring for Patients   Your provider has recommended that you check your blood pressure (BP) at least once a week at home. If you do not have a blood pressure cuff at home, one will be provided for you. Contact your provider if you have not received your monitor within 1 week.   Helpful Tips for Accurate Home Blood Pressure Checks  Don't smoke, exercise, or drink caffeine 30 minutes before checking your BP Use the restroom before checking your BP (a full bladder can raise your pressure) Relax in a comfortable upright chair Feet on the ground Left arm resting comfortably on a flat surface at the level of your heart Legs uncrossed Back supported Sit quietly and don't talk Place the cuff on your bare arm Adjust snuggly, so that only two fingertips can fit between your skin and the top of the cuff Check 2 readings separated by at least one minute Keep a log of your BP readings For a visual, please reference this diagram: http://ccnc.care/bpdiagram  Provider Name: [facility]     Phone: [phone]  Zone 1: ALL CLEAR  Continue to monitor your symptoms:  BP reading is less than 140 (top number) or less than 90 (bottom   number)  No right upper stomach pain No headaches or seeing spots No feeling nauseated or throwing up No swelling in face and hands  Zone 2: CAUTION Call your doctor's office for any of the following:  BP reading is greater than 140 (top number) or greater than 90 (bottom number)  Stomach pain under your ribs in the middle or right side Headaches or seeing spots Feeling nauseated or throwing up Swelling in face and hands  Zone 3: EMERGENCY  Seek immediate medical care if you have any of the following:  BP reading is greater than160 (top number) or greater than  110 (bottom number) Severe headaches not improving with Tylenol Serious difficulty catching your breath Any worsening symptoms from Zone 2    First Trimester of Pregnancy The first trimester of pregnancy is from week 1 until the end of week 12 (months 1 through 3). A week after a sperm fertilizes an egg, the egg will implant on the wall of the uterus. This embryo will begin to develop into a baby. Genes from you and your partner are forming the baby. The female genes determine whether the baby is a boy or a girl. At 6-8 weeks, the eyes and face are formed, and the heartbeat can be seen on ultrasound. At the end of 12 weeks, all the baby's organs are formed.  Now that you are pregnant, you will want to do everything you can to have a healthy baby. Two of the most important things are to get good prenatal care and to follow your health care provider's instructions. Prenatal care is all the medical care you receive before the baby's birth. This care will help prevent, find, and treat any problems during the pregnancy and childbirth. BODY CHANGES Your body goes through many changes during pregnancy. The changes vary from woman to woman.  You may gain or lose a couple of pounds at first. You may feel sick to your stomach (nauseous) and throw up (vomit). If the vomiting is uncontrollable, call your health care provider. You may tire easily. You may develop headaches that can be relieved by medicines approved by your health care provider. You may urinate more often. Painful urination may mean you have a bladder infection. You may develop heartburn as a result of your pregnancy. You may develop constipation because certain hormones are causing the muscles that push waste through your intestines to slow down. You may develop hemorrhoids or swollen, bulging veins (varicose veins). Your breasts may begin to grow larger and become tender. Your nipples may stick out more, and the tissue that surrounds them  (areola) may become darker. Your gums may bleed and may be sensitive to brushing and flossing. Dark spots or blotches (chloasma, mask of pregnancy) may develop on your face. This will likely fade after the baby is born. Your menstrual periods will stop. You may have a loss of appetite. You may develop cravings for certain kinds of food. You may have changes in your emotions from day to day, such as being excited to be pregnant or being concerned that something may go wrong with the pregnancy and baby. You may have more vivid and strange dreams. You may have changes in your hair. These can include thickening of your hair, rapid growth, and changes in texture. Some women also have hair loss during or after pregnancy, or hair that feels dry or thin. Your hair will most likely return to normal after your baby is born. WHAT TO EXPECT AT YOUR PRENATAL  VISITS During a routine prenatal visit: You will be weighed to make sure you and the baby are growing normally. Your blood pressure will be taken. Your abdomen will be measured to track your baby's growth. The fetal heartbeat will be listened to starting around week 10 or 12 of your pregnancy. Test results from any previous visits will be discussed. Your health care provider may ask you: How you are feeling. If you are feeling the baby move. If you have had any abnormal symptoms, such as leaking fluid, bleeding, severe headaches, or abdominal cramping. If you have any questions. Other tests that may be performed during your first trimester include: Blood tests to find your blood type and to check for the presence of any previous infections. They will also be used to check for low iron levels (anemia) and Rh antibodies. Later in the pregnancy, blood tests for diabetes will be done along with other tests if problems develop. Urine tests to check for infections, diabetes, or protein in the urine. An ultrasound to confirm the proper growth and development  of the baby. An amniocentesis to check for possible genetic problems. Fetal screens for spina bifida and Down syndrome. You may need other tests to make sure you and the baby are doing well. HOME CARE INSTRUCTIONS  Medicines Follow your health care provider's instructions regarding medicine use. Specific medicines may be either safe or unsafe to take during pregnancy. Take your prenatal vitamins as directed. If you develop constipation, try taking a stool softener if your health care provider approves. Diet Eat regular, well-balanced meals. Choose a variety of foods, such as meat or vegetable-based protein, fish, milk and low-fat dairy products, vegetables, fruits, and whole grain breads and cereals. Your health care provider will help you determine the amount of weight gain that is right for you. Avoid raw meat and uncooked cheese. These carry germs that can cause birth defects in the baby. Eating four or five small meals rather than three large meals a day may help relieve nausea and vomiting. If you start to feel nauseous, eating a few soda crackers can be helpful. Drinking liquids between meals instead of during meals also seems to help nausea and vomiting. If you develop constipation, eat more high-fiber foods, such as fresh vegetables or fruit and whole grains. Drink enough fluids to keep your urine clear or pale yellow. Activity and Exercise Exercise only as directed by your health care provider. Exercising will help you: Control your weight. Stay in shape. Be prepared for labor and delivery. Experiencing pain or cramping in the lower abdomen or low back is a good sign that you should stop exercising. Check with your health care provider before continuing normal exercises. Try to avoid standing for long periods of time. Move your legs often if you must stand in one place for a long time. Avoid heavy lifting. Wear low-heeled shoes, and practice good posture. You may continue to have sex  unless your health care provider directs you otherwise. Relief of Pain or Discomfort Wear a good support bra for breast tenderness.   Take warm sitz baths to soothe any pain or discomfort caused by hemorrhoids. Use hemorrhoid cream if your health care provider approves.   Rest with your legs elevated if you have leg cramps or low back pain. If you develop varicose veins in your legs, wear support hose. Elevate your feet for 15 minutes, 3-4 times a day. Limit salt in your diet. Prenatal Care Schedule your prenatal visits by the  twelfth week of pregnancy. They are usually scheduled monthly at first, then more often in the last 2 months before delivery. Write down your questions. Take them to your prenatal visits. Keep all your prenatal visits as directed by your health care provider. Safety Wear your seat belt at all times when driving. Make a list of emergency phone numbers, including numbers for family, friends, the hospital, and police and fire departments. General Tips Ask your health care provider for a referral to a local prenatal education class. Begin classes no later than at the beginning of month 6 of your pregnancy. Ask for help if you have counseling or nutritional needs during pregnancy. Your health care provider can offer advice or refer you to specialists for help with various needs. Do not use hot tubs, steam rooms, or saunas. Do not douche or use tampons or scented sanitary pads. Do not cross your legs for long periods of time. Avoid cat litter boxes and soil used by cats. These carry germs that can cause birth defects in the baby and possibly loss of the fetus by miscarriage or stillbirth. Avoid all smoking, herbs, alcohol, and medicines not prescribed by your health care provider. Chemicals in these affect the formation and growth of the baby. Schedule a dentist appointment. At home, brush your teeth with a soft toothbrush and be gentle when you floss. SEEK MEDICAL CARE IF:   You have dizziness. You have mild pelvic cramps, pelvic pressure, or nagging pain in the abdominal area. You have persistent nausea, vomiting, or diarrhea. You have a bad smelling vaginal discharge. You have pain with urination. You notice increased swelling in your face, hands, legs, or ankles. SEEK IMMEDIATE MEDICAL CARE IF:  You have a fever. You are leaking fluid from your vagina. You have spotting or bleeding from your vagina. You have severe abdominal cramping or pain. You have rapid weight gain or loss. You vomit blood or material that looks like coffee grounds. You are exposed to [place] measles and have never had them. You are exposed to fifth disease or chickenpox. You develop a severe headache. You have shortness of breath. You have any kind of trauma, such as from a fall or a car accident. Document Released: 10/16/2001 Document Revised: 03/08/2014 Document Reviewed: 09/01/2013 [facility] Patient Information 2015 [place], [place]. This information is not intended to replace advice given to you by your health care provider. Make sure you discuss any questions you have with your health care provider.

## 2024-07-01 LAB — CBC/D/PLT+RPR+RH+ABO+RUBIGG...
Antibody Screen: NEGATIVE
Basophils Absolute: 0.1 x10E3/uL (ref 0.0–0.2)
Basos: 1 %
EOS (ABSOLUTE): 0.3 x10E3/uL (ref 0.0–0.4)
Eos: 4 %
HCV Ab: NONREACTIVE
HIV Screen 4th Generation wRfx: NONREACTIVE
Hematocrit: 38.5 % (ref 34.0–46.6)
Hemoglobin: 12.9 g/dL (ref 11.1–15.9)
Hepatitis B Surface Ag: NEGATIVE
Immature Grans (Abs): 0 x10E3/uL (ref 0.0–0.1)
Immature Granulocytes: 0 %
Lymphocytes Absolute: 2.6 x10E3/uL (ref 0.7–3.1)
Lymphs: 32 %
MCH: 29.3 pg (ref 26.6–33.0)
MCHC: 33.5 g/dL (ref 31.5–35.7)
MCV: 87 fL (ref 79–97)
Monocytes Absolute: 0.6 x10E3/uL (ref 0.1–0.9)
Monocytes: 7 %
Neutrophils Absolute: 4.6 x10E3/uL (ref 1.4–7.0)
Neutrophils: 56 %
Platelets: 301 x10E3/uL (ref 150–450)
RBC: 4.41 x10E6/uL (ref 3.77–5.28)
RDW: 12.6 % (ref 11.7–15.4)
RPR Ser Ql: NONREACTIVE
Rh Factor: POSITIVE
Rubella Antibodies, IGG: 2.46 {index} (ref >0.99)
WBC: 8.1 x10E3/uL (ref 3.4–10.8)

## 2024-07-01 LAB — COMPREHENSIVE METABOLIC PANEL WITH GFR
ALT: 16 IU/L (ref 0–32)
AST: 20 IU/L (ref 0–40)
Albumin: 4.4 g/dL (ref 4.0–5.0)
Alkaline Phosphatase: 63 IU/L (ref 44–121)
BUN/Creatinine Ratio: 19 (ref 9–23)
BUN: 9 mg/dL (ref 6–20)
Bilirubin Total: <0.2 mg/dL (ref 0.0–1.2)
CO2: 20 mmol/L (ref 20–29)
Calcium: 9.2 mg/dL (ref 8.7–10.2)
Chloride: 102 mmol/L (ref 96–106)
Creatinine, Ser: 0.47 mg/dL — ABNORMAL LOW (ref 0.57–1.00)
Globulin, Total: 3.1 g/dL (ref 1.5–4.5)
Glucose: 89 mg/dL (ref 70–99)
Potassium: 4 mmol/L (ref 3.5–5.2)
Sodium: 136 mmol/L (ref 134–144)
Total Protein: 7.5 g/dL (ref 6.0–8.5)
eGFR: 135 mL/min/1.73 (ref >59)

## 2024-07-01 LAB — HEMOGLOBIN A1C
Est. average glucose Bld gHb Est-mCnc: 105 mg/dL
Hgb A1c MFr Bld: 5.3 % (ref 4.8–5.6)

## 2024-07-01 LAB — HCV INTERPRETATION

## 2024-07-03 LAB — CYTOLOGY - PAP
Chlamydia: NEGATIVE
Comment: NEGATIVE
Comment: NEGATIVE
Comment: NORMAL
Diagnosis: NEGATIVE
High risk HPV: NEGATIVE
Neisseria Gonorrhea: NEGATIVE

## 2024-07-07 ENCOUNTER — Ambulatory Visit: Payer: Self-pay | Admitting: Women's Health

## 2024-07-07 DIAGNOSIS — Z3480 Encounter for supervision of other normal pregnancy, unspecified trimester: Secondary | ICD-10-CM

## 2024-07-07 LAB — PANORAMA PRENATAL TEST FULL PANEL:PANORAMA TEST PLUS 5 ADDITIONAL MICRODELETIONS: FETAL FRACTION: 12.2

## 2024-07-28 ENCOUNTER — Encounter: Payer: Self-pay | Admitting: Women's Health

## 2024-07-28 ENCOUNTER — Ambulatory Visit (INDEPENDENT_AMBULATORY_CARE_PROVIDER_SITE_OTHER): Admitting: Women's Health

## 2024-07-28 VITALS — BP 113/66 | Wt 170.8 lb

## 2024-07-28 DIAGNOSIS — O2341 Unspecified infection of urinary tract in pregnancy, first trimester: Secondary | ICD-10-CM | POA: Diagnosis not present

## 2024-07-28 DIAGNOSIS — Z3A16 16 weeks gestation of pregnancy: Secondary | ICD-10-CM | POA: Diagnosis not present

## 2024-07-28 DIAGNOSIS — Z3482 Encounter for supervision of other normal pregnancy, second trimester: Secondary | ICD-10-CM

## 2024-07-28 DIAGNOSIS — O09299 Supervision of pregnancy with other poor reproductive or obstetric history, unspecified trimester: Secondary | ICD-10-CM

## 2024-07-28 DIAGNOSIS — O2342 Unspecified infection of urinary tract in pregnancy, second trimester: Secondary | ICD-10-CM

## 2024-07-28 DIAGNOSIS — Z1379 Encounter for other screening for genetic and chromosomal anomalies: Secondary | ICD-10-CM | POA: Diagnosis not present

## 2024-07-28 DIAGNOSIS — Z363 Encounter for antenatal screening for malformations: Secondary | ICD-10-CM

## 2024-07-28 DIAGNOSIS — Z3480 Encounter for supervision of other normal pregnancy, unspecified trimester: Secondary | ICD-10-CM

## 2024-07-28 DIAGNOSIS — O09292 Supervision of pregnancy with other poor reproductive or obstetric history, second trimester: Secondary | ICD-10-CM | POA: Diagnosis not present

## 2024-07-28 NOTE — Progress Notes (Signed)
 LOW-RISK PREGNANCY VISIT Patient name: Melinda Wright MRN [id number]  Date of birth: [birth year] Chief Complaint:   Routine Prenatal Visit (AFP, urine culture and pro-creat)  History of Present Illness:   Melinda Wright is a 28 y.o. G46P1001 female at [redacted]w[redacted]d with an Estimated Date of Delivery: 01/08/25 being seen today for ongoing management of a low-risk pregnancy.   Today she reports daily bouts of dry heaving, occ vomits. Declines meds. Contractions: Not present.  .  Movement: Absent. denies leaking of fluid.     06/30/2024    3:21 PM 06/30/2021    8:49 AM 04/04/2021    3:31 PM 02/16/2021   11:10 AM  Depression screen PHQ 2/9  Decreased Interest 0 0 0 0  Down, Depressed, Hopeless 0 0 0 0  PHQ - 2 Score 0 0 0 0  Altered sleeping 0 1 1 2   Tired, decreased energy 0 1 0 2  Change in appetite 0 0 0 0  Feeling bad or failure about yourself  0 0 0 0  Trouble concentrating 0 0 0 0  Moving slowly or fidgety/restless 0 0 0 0  Suicidal thoughts 0 0 0 0  PHQ-9 Score 0 2 1 4         06/30/2024    3:21 PM 06/30/2021    8:50 AM 04/04/2021    3:31 PM 02/16/2021   11:12 AM  GAD 7 : Generalized Anxiety Score  Nervous, Anxious, on Edge 0 0 0 0  Control/stop worrying 0 0 0 0  Worry too much - different things 0 0 0 0  Trouble relaxing 0 0 0 0  Restless 0 0 0 0  Easily annoyed or irritable 0 0 0 0  Afraid - awful might happen 0 0 0 0  Total GAD 7 Score 0 0 0 0      Review of Systems:   Pertinent items are noted in HPI Denies abnormal vaginal discharge w/ itching/odor/irritation, headaches, visual changes, shortness of breath, chest pain, abdominal pain, severe nausea/vomiting, or problems with urination or bowel movements unless otherwise stated above. Pertinent History Reviewed:  Reviewed past medical,surgical, social, obstetrical and family history.  Reviewed problem list, medications and allergies. Physical Assessment:   Vitals:   07/28/24 1457  BP: 113/66  Weight: 170 lb 12.8  oz (77.5 kg)  Body mass index is 28.42 kg/m.        Physical Examination:   General appearance: Well appearing, and in no distress  Mental status: Alert, oriented to person, place, and time  Skin: Warm & dry  Cardiovascular: Normal heart rate noted  Respiratory: Normal respiratory effort, no distress  Abdomen: Soft, gravid, nontender  Pelvic: Cervical exam deferred         Extremities: Edema: None  Fetal Status: Fetal Heart Rate (bpm): 142   Movement: Absent    Chaperone: N/A No results found for this or any previous visit (from the past 24 hours).  Assessment & Plan:  1) Low-risk pregnancy G2P1001 at [redacted]w[redacted]d with an Estimated Date of Delivery: 01/08/25   2) Dry heaving/occ vomiting, declines meds, gave printed tips, is going to try Sea Bands and ginger  3) H/O pre-e> hasn't started ASA yet, forgot, to start asap; P:C today (didn't do last visit d/t UTI)  4) Recent UTI> urine cx poc today   Meds: No orders of the defined types were placed in this encounter.  Labs/procedures today: urine culture, AFP, and P:C ratio  Plan:  Continue routine  obstetrical care  Next visit: prefers will be in person for u/s    Reviewed: Preterm labor symptoms and general obstetric precautions including but not limited to vaginal bleeding, contractions, leaking of fluid and fetal movement were reviewed in detail with the patient.  All questions were answered. Does have home bp cuff. Office bp cuff given: not applicable. Check bp weekly, let us  know if consistently >140 and/or >90.  Follow-up: No follow-ups on file.  Future Appointments  Date Time Provider Department Center  08/24/2024  3:00 PM [facility]  [facility] None  08/24/2024  3:50 PM Kizzie Suzen SAUNDERS, CNM [facility]    Orders Placed This Encounter  Procedures   Urine Culture   US  OB Comp + 14 Wk   Protein / creatinine ratio, urine   AFP, Serum, Open Spina Bifida   Suzen SAUNDERS Kizzie CNM, [facility] 07/28/2024 3:37 PM

## 2024-07-28 NOTE — Patient Instructions (Addendum)
 [place], thank you for choosing our office today! We appreciate the opportunity to meet your healthcare needs. You may receive a short survey by mail, e-mail, or through [employer]. If you are happy with your care we would appreciate if you could take just a few minutes to complete the survey questions. We read all of your comments and take your feedback very seriously. Thank you again for choosing our office.  Center for [employer] Team at [facility] at [facility] ([address] [place], [place] [address]) Entrance C, located off of [address] Free 24/7 valet parking  Go to [email or web address] to register for FREE online childbirth classes  Call the office [phone]) or go to [facility] if: You begin to severe cramping Your water breaks.  Sometimes it is a big gush of fluid, sometimes it is just a trickle that keeps getting your panties wet or running down your legs You have vaginal bleeding.  It is normal to have a small amount of spotting if your cervix was checked.   [facility] Pediatricians/Family Doctors [facility]): [address] [place] BROCKS, [phone]           [facility]: [address], [phone]                [facility] Medicine [facility]): [address], [phone] (call to ask if accepting patients) [facility] Department: [address], [place], [phone]    [facility] Pediatricians/Family Doctors [facility]): [phone] [address], [phone] [facility]: [address] [place], [phone] [facility]: [address], [phone]  [facility] Doctors  [facility]): [phone] [facility]: [address] [phone]   [facility] Doctors [facility]: [address] [phone]  [facility]  [facility]: [phone], [phone]  Nausea & Vomiting Have saltine crackers or pretzels by your bed and eat a few bites before you raise your head out of bed in the morning Eat small frequent meals throughout the day instead of large meals Drink plenty of fluids throughout the day to stay hydrated, just don't drink a lot of fluids with your meals.  This can make your stomach fill up faster making you feel sick Do not brush your teeth right after you eat Products with real ginger are good for nausea, like ginger ale and ginger hard candy Make sure it says made with real ginger! Sucking on sour candy like lemon heads is also good for nausea If your prenatal vitamins make you nauseated, take them at night so you will sleep through the nausea Sea Bands If you feel like you need medicine for the nausea & vomiting please let us  know If you are unable to keep any fluids or food down please let us  know    Home Blood Pressure Monitoring for Patients   Your provider has recommended that you check your blood pressure (BP) at least once a week at home. If you do not have a blood pressure cuff at home, one will be provided for you. Contact your provider if you have not received your monitor within 1 week.   Helpful Tips for Accurate Home Blood Pressure Checks  Don't smoke, exercise, or drink caffeine 30 minutes before checking your BP Use the restroom before checking your BP (a full bladder can raise your  pressure) Relax in a comfortable upright chair Feet on the ground Left arm resting comfortably on a flat surface at the level of your heart Legs uncrossed Back supported Sit quietly and don't talk Place the cuff on your bare arm Adjust snuggly, so that only two fingertips can fit between your skin and the top of the cuff Check 2 readings separated by at least one minute Keep a log of your BP readings For a visual, please reference this diagram: http://ccnc.care/bpdiagram  Provider Name:  [facility]     Phone: [phone]  Zone 1: ALL CLEAR  Continue to monitor your symptoms:  BP reading is less than 140 (top number) or less than 90 (bottom number)  No right upper stomach pain No headaches or seeing spots No feeling nauseated or throwing up No swelling in face and hands  Zone 2: CAUTION Call your doctor's office for any of the following:  BP reading is greater than 140 (top number) or greater than 90 (bottom number)  Stomach pain under your ribs in the middle or right side Headaches or seeing spots Feeling nauseated or throwing up Swelling in face and hands  Zone 3: EMERGENCY  Seek immediate medical care if you have any of the following:  BP reading is greater than160 (top number) or greater than 110 (bottom number) Severe headaches not improving with Tylenol  Serious difficulty catching your breath Any worsening symptoms from Zone 2     Second Trimester of Pregnancy The second trimester is from week 14 through week 27 (months 4 through 6). The second trimester is often a time when you feel your best. Your body has adjusted to being pregnant, and you begin to feel better physically. Usually, morning sickness has lessened or quit completely, you may have more energy, and you may have an increase in appetite. The second trimester is also a time when the fetus is growing rapidly. At the end of the sixth month, the fetus is about 9 inches long and weighs about 1 pounds. You will likely begin to feel the baby move (quickening) between 16 and 20 weeks of pregnancy. Body changes during your second trimester Your body continues to go through many changes during your second trimester. The changes vary from woman to woman. Your weight will continue to increase. You will notice your lower abdomen bulging out. You may begin to get stretch marks on your hips, abdomen, and breasts. You may develop headaches that can be relieved by medicines. The medicines should be approved  by your health care provider. You may urinate more often because the fetus is pressing on your bladder. You may develop or continue to have heartburn as a result of your pregnancy. You may develop constipation because certain hormones are causing the muscles that push waste through your intestines to slow down. You may develop hemorrhoids or swollen, bulging veins (varicose veins). You may have back pain. This is caused by: Weight gain. Pregnancy hormones that are relaxing the joints in your pelvis. A shift in weight and the muscles that support your balance. Your breasts will continue to grow and they will continue to become tender. Your gums may bleed and may be sensitive to brushing and flossing. Dark spots or blotches (chloasma, mask of pregnancy) may develop on your face. This will likely fade after the baby is born. A dark line from your belly button to the pubic area (linea nigra) may appear. This will likely fade after the baby is born. You may have  changes in your hair. These can include thickening of your hair, rapid growth, and changes in texture. Some women also have hair loss during or after pregnancy, or hair that feels dry or thin. Your hair will most likely return to normal after your baby is born.  What to expect at prenatal visits During a routine prenatal visit: You will be weighed to make sure you and the fetus are growing normally. Your blood pressure will be taken. Your abdomen will be measured to track your baby's growth. The fetal heartbeat will be listened to. Any test results from the previous visit will be discussed.  Your health care provider may ask you: How you are feeling. If you are feeling the baby move. If you have had any abnormal symptoms, such as leaking fluid, bleeding, severe headaches, or abdominal cramping. If you are using any tobacco products, including cigarettes, chewing tobacco, and electronic cigarettes. If you have any questions.  Other  tests that may be performed during your second trimester include: Blood tests that check for: Low iron levels (anemia). High blood sugar that affects pregnant women (gestational diabetes) between 73 and 28 weeks. Rh antibodies. This is to check for a protein on red blood cells (Rh factor). Urine tests to check for infections, diabetes, or protein in the urine. An ultrasound to confirm the proper growth and development of the baby. An amniocentesis to check for possible genetic problems. Fetal screens for spina bifida and Down syndrome. HIV (human immunodeficiency virus) testing. Routine prenatal testing includes screening for HIV, unless you choose not to have this test.  Follow these instructions at home: Medicines Follow your health care provider's instructions regarding medicine use. Specific medicines may be either safe or unsafe to take during pregnancy. Take a prenatal vitamin that contains at least 600 micrograms (mcg) of folic acid. If you develop constipation, try taking a stool softener if your health care provider approves. Eating and drinking Eat a balanced diet that includes fresh fruits and vegetables, whole grains, good sources of protein such as meat, eggs, or tofu, and low-fat dairy. Your health care provider will help you determine the amount of weight gain that is right for you. Avoid raw meat and uncooked cheese. These carry germs that can cause birth defects in the baby. If you have low calcium intake from food, talk to your health care provider about whether you should take a daily calcium supplement. Limit foods that are high in fat and processed sugars, such as fried and sweet foods. To prevent constipation: Drink enough fluid to keep your urine clear or pale yellow. Eat foods that are high in fiber, such as fresh fruits and vegetables, whole grains, and beans. Activity Exercise only as directed by your health care provider. Most women can continue their usual  exercise routine during pregnancy. Try to exercise for 30 minutes at least 5 days a week. Stop exercising if you experience uterine contractions. Avoid heavy lifting, wear low heel shoes, and practice good posture. A sexual relationship may be continued unless your health care provider directs you otherwise. Relieving pain and discomfort Wear a good support bra to prevent discomfort from breast tenderness. Take warm sitz baths to soothe any pain or discomfort caused by hemorrhoids. Use hemorrhoid cream if your health care provider approves. Rest with your legs elevated if you have leg cramps or low back pain. If you develop varicose veins, wear support hose. Elevate your feet for 15 minutes, 3-4 times a day. Limit salt in your  diet. Prenatal Care Write down your questions. Take them to your prenatal visits. Keep all your prenatal visits as told by your health care provider. This is important. Safety Wear your seat belt at all times when driving. Make a list of emergency phone numbers, including numbers for family, friends, the hospital, and police and fire departments. General instructions Ask your health care provider for a referral to a local prenatal education class. Begin classes no later than the beginning of month 6 of your pregnancy. Ask for help if you have counseling or nutritional needs during pregnancy. Your health care provider can offer advice or refer you to specialists for help with various needs. Do not use hot tubs, steam rooms, or saunas. Do not douche or use tampons or scented sanitary pads. Do not cross your legs for long periods of time. Avoid cat litter boxes and soil used by cats. These carry germs that can cause birth defects in the baby and possibly loss of the fetus by miscarriage or stillbirth. Avoid all smoking, herbs, alcohol, and unprescribed drugs. Chemicals in these products can affect the formation and growth of the baby. Do not use any products that contain  nicotine or tobacco, such as cigarettes and e-cigarettes. If you need help quitting, ask your health care provider. Visit your dentist if you have not gone yet during your pregnancy. Use a soft toothbrush to brush your teeth and be gentle when you floss. Contact a health care provider if: You have dizziness. You have mild pelvic cramps, pelvic pressure, or nagging pain in the abdominal area. You have persistent nausea, vomiting, or diarrhea. You have a bad smelling vaginal discharge. You have pain when you urinate. Get help right away if: You have a fever. You are leaking fluid from your vagina. You have spotting or bleeding from your vagina. You have severe abdominal cramping or pain. You have rapid weight gain or weight loss. You have shortness of breath with chest pain. You notice sudden or extreme swelling of your face, hands, ankles, feet, or legs. You have not felt your baby move in over an hour. You have severe headaches that do not go away when you take medicine. You have vision changes. Summary The second trimester is from week 14 through week 27 (months 4 through 6). It is also a time when the fetus is growing rapidly. Your body goes through many changes during pregnancy. The changes vary from woman to woman. Avoid all smoking, herbs, alcohol, and unprescribed drugs. These chemicals affect the formation and growth your baby. Do not use any tobacco products, such as cigarettes, chewing tobacco, and e-cigarettes. If you need help quitting, ask your health care provider. Contact your health care provider if you have any questions. Keep all prenatal visits as told by your health care provider. This is important. This information is not intended to replace advice given to you by your health care provider. Make sure you discuss any questions you have with your health care provider. Document Released: 10/16/2001 Document Revised: 03/29/2016 Document Reviewed: 12/23/2012 Elsevier  Interactive Patient Education  2017 [employer].

## 2024-07-29 LAB — PROTEIN / CREATININE RATIO, URINE
Creatinine, Urine: 54.6 mg/dL
Protein, Ur: 5.6 mg/dL
Protein/Creat Ratio: 103 mg/g{creat} (ref 0–200)

## 2024-07-30 LAB — URINE CULTURE: Organism ID, Bacteria: NO GROWTH

## 2024-08-03 ENCOUNTER — Ambulatory Visit: Payer: Self-pay | Admitting: Women's Health

## 2024-08-03 DIAGNOSIS — Z3480 Encounter for supervision of other normal pregnancy, unspecified trimester: Secondary | ICD-10-CM

## 2024-08-03 LAB — AFP, SERUM, OPEN SPINA BIFIDA
AFP MoM: 0.7
AFP Value: 21.4 ng/mL
Gest. Age on Collection Date: 16 wk
Maternal Age At EDD: 27.1 a
OSBR Risk 1 IN: 10000
Test Results:: NEGATIVE
Weight: 170 [lb_av]

## 2024-08-24 ENCOUNTER — Encounter: Payer: Self-pay | Admitting: Women's Health

## 2024-08-24 ENCOUNTER — Ambulatory Visit (INDEPENDENT_AMBULATORY_CARE_PROVIDER_SITE_OTHER)

## 2024-08-24 ENCOUNTER — Ambulatory Visit (INDEPENDENT_AMBULATORY_CARE_PROVIDER_SITE_OTHER): Admitting: Women's Health

## 2024-08-24 VITALS — BP 117/72 | HR 82 | Wt 174.0 lb

## 2024-08-24 DIAGNOSIS — O09292 Supervision of pregnancy with other poor reproductive or obstetric history, second trimester: Secondary | ICD-10-CM | POA: Diagnosis not present

## 2024-08-24 DIAGNOSIS — Z3480 Encounter for supervision of other normal pregnancy, unspecified trimester: Secondary | ICD-10-CM

## 2024-08-24 DIAGNOSIS — Z363 Encounter for antenatal screening for malformations: Secondary | ICD-10-CM | POA: Diagnosis not present

## 2024-08-24 DIAGNOSIS — Z3A20 20 weeks gestation of pregnancy: Secondary | ICD-10-CM

## 2024-08-24 DIAGNOSIS — Z3482 Encounter for supervision of other normal pregnancy, second trimester: Secondary | ICD-10-CM

## 2024-08-24 NOTE — Progress Notes (Signed)
 US  20+3 wks,breech,LVEICF,FHR 143 bpm,CX 4.5 cm,anterior placenta gr 0,normal ovaries,SVP of fluid 4.1 cm,EFW 331 g 27%,anatomy complete

## 2024-08-24 NOTE — Patient Instructions (Signed)
Melinda Wright, thank you for choosing our office today! We appreciate the opportunity to meet your healthcare needs. You may receive a short survey by mail, e-mail, or through MyChart. If you are happy with your care we would appreciate if you could take just a few minutes to complete the survey questions. We read all of your comments and take your feedback very seriously. Thank you again for choosing our office.  [facility] Team at [facility] ([address] [place], [place] [address]) Entrance C, located off of [address] Free 24/7 valet parking  Go to [email or web address] to register for FREE online childbirth classes  Call the office ([phone]) or go to [facility] if: You begin to severe cramping Your water breaks.  Sometimes it is a big gush of fluid, sometimes it is just a trickle that keeps getting your panties wet or running down your legs You have vaginal bleeding.  It is normal to have a small amount of spotting if your cervix was checked.   [place] Pediatricians/Family Doctors [facility] ([facility]): [address], [phone]           [facility]: [address], [phone]                [facility] ([facility]): [address], [phone] (call to ask if accepting patients) [facility]: [address], [place], [phone]    [place] Pediatricians/Family Doctors [facility] ([facility]): [address], [phone] [facility]: [address], [phone] [facility]: [address], [phone]  [place] [facility]  [facility] ([facility]): [phone] [facility]: [address], [phone]   [place] [facility] Doctors [facility]: [address], [phone]  [place] [facility]  [facility]: [phone], [phone]  Home Blood Pressure Monitoring for Patients   Your provider has recommended that you check your blood pressure (BP) at least once a week at home. If you do not have a blood pressure cuff at home, one will be provided for you. Contact your provider if you have not received your monitor within 1 week.   Helpful Tips for Accurate Home Blood Pressure Checks  Don't smoke, exercise, or drink caffeine 30 minutes before checking your BP Use the restroom before checking your BP (a full bladder can raise your pressure) Relax in a comfortable upright chair Feet on the ground Left arm resting comfortably on a flat surface at the level of your heart Legs uncrossed Back supported Sit quietly and don't talk Place the cuff on your bare arm Adjust snuggly, so that only two fingertips can fit between your skin and the top of the cuff Check 2 readings separated by at least one minute Keep a log of your BP readings For a visual, please reference this diagram: http://[facility].care/bpdiagram  Provider Name: [facility]     Phone: [phone]  Zone 1: ALL CLEAR  Continue to monitor your symptoms:  BP reading is less than 140 (top number) or less than 90 (bottom number)  No right upper stomach pain No headaches or seeing spots No feeling nauseated or throwing up No swelling in face and hands  Zone 2: CAUTION Call your doctor's office for any of the following:  BP reading is greater than 140 (top number) or greater than   90 (bottom number)  Stomach pain under your ribs in the middle or right side Headaches or seeing spots Feeling nauseated or throwing up Swelling in face and hands  Zone 3: EMERGENCY  Seek immediate medical care if you have any of the following:  BP reading is greater than160 (top number) or greater than 110 (bottom number) Severe headaches not improving with Tylenol Serious difficulty catching your breath Any worsening symptoms from  Zone 2     Second Trimester of Pregnancy The second trimester is from week 14 through week 27 (months 4 through 6). The second trimester is often a time when you feel your best. Your body has adjusted to being pregnant, and you begin to feel better physically. Usually, morning sickness has lessened or quit completely, you may have more energy, and you may have an increase in appetite. The second trimester is also a time when the fetus is growing rapidly. At the end of the sixth month, the fetus is about 9 inches long and weighs about 1 pounds. You will likely begin to feel the baby move (quickening) between 16 and 20 weeks of pregnancy. Body changes during your second trimester Your body continues to go through many changes during your second trimester. The changes vary from woman to woman. Your weight will continue to increase. You will notice your lower abdomen bulging out. You may begin to get stretch marks on your hips, abdomen, and breasts. You may develop headaches that can be relieved by medicines. The medicines should be approved by your health care provider. You may urinate more often because the fetus is pressing on your bladder. You may develop or continue to have heartburn as a result of your pregnancy. You may develop constipation because certain hormones are causing the muscles that push waste through your intestines to slow down. You may develop hemorrhoids or swollen, bulging veins (varicose veins). You may have back pain. This is caused by: Weight gain. Pregnancy hormones that are relaxing the joints in your pelvis. A shift in weight and the muscles that support your balance. Your breasts will continue to grow and they will continue to become tender. Your gums may bleed and may be sensitive to brushing and flossing. Dark spots or blotches (chloasma, mask of pregnancy) may develop on your face. This will likely fade after the baby is born. A dark line from your belly button to  the pubic area (linea nigra) may appear. This will likely fade after the baby is born. You may have changes in your hair. These can include thickening of your hair, rapid growth, and changes in texture. Some women also have hair loss during or after pregnancy, or hair that feels dry or thin. Your hair will most likely return to normal after your baby is born.  What to expect at prenatal visits During a routine prenatal visit: You will be weighed to make sure you and the fetus are growing normally. Your blood pressure will be taken. Your abdomen will be measured to track your baby's growth. The fetal heartbeat will be listened to. Any test results from the previous visit will be discussed.  Your health care provider may ask you: How you are feeling. If you are feeling the baby move. If you have had any abnormal symptoms, such as leaking fluid, bleeding, severe headaches, or abdominal cramping. If you are using any tobacco products, including cigarettes, chewing tobacco, and electronic cigarettes. If you have any questions.  Other tests that may be performed during   your second trimester include: Blood tests that check for: Low iron levels (anemia). High blood sugar that affects pregnant women (gestational diabetes) between 24 and 28 weeks. Rh antibodies. This is to check for a protein on red blood cells (Rh factor). Urine tests to check for infections, diabetes, or protein in the urine. An ultrasound to confirm the proper growth and development of the baby. An amniocentesis to check for possible genetic problems. Fetal screens for spina bifida and Down syndrome. HIV (human immunodeficiency virus) testing. Routine prenatal testing includes screening for HIV, unless you choose not to have this test.  Follow these instructions at home: Medicines Follow your health care provider's instructions regarding medicine use. Specific medicines may be either safe or unsafe to take during  pregnancy. Take a prenatal vitamin that contains at least 600 micrograms (mcg) of folic acid. If you develop constipation, try taking a stool softener if your health care provider approves. Eating and drinking Eat a balanced diet that includes fresh fruits and vegetables, whole grains, good sources of protein such as meat, eggs, or tofu, and low-fat dairy. Your health care provider will help you determine the amount of weight gain that is right for you. Avoid raw meat and uncooked cheese. These carry germs that can cause birth defects in the baby. If you have low calcium intake from food, talk to your health care provider about whether you should take a daily calcium supplement. Limit foods that are high in fat and processed sugars, such as fried and sweet foods. To prevent constipation: Drink enough fluid to keep your urine clear or pale yellow. Eat foods that are high in fiber, such as fresh fruits and vegetables, whole grains, and beans. Activity Exercise only as directed by your health care provider. Most women can continue their usual exercise routine during pregnancy. Try to exercise for 30 minutes at least 5 days a week. Stop exercising if you experience uterine contractions. Avoid heavy lifting, wear low heel shoes, and practice good posture. A sexual relationship may be continued unless your health care provider directs you otherwise. Relieving pain and discomfort Wear a good support bra to prevent discomfort from breast tenderness. Take warm sitz baths to soothe any pain or discomfort caused by hemorrhoids. Use hemorrhoid cream if your health care provider approves. Rest with your legs elevated if you have leg cramps or low back pain. If you develop varicose veins, wear support hose. Elevate your feet for 15 minutes, 3-4 times a day. Limit salt in your diet. Prenatal Care Write down your questions. Take them to your prenatal visits. Keep all your prenatal visits as told by your health  care provider. This is important. Safety Wear your seat belt at all times when driving. Make a list of emergency phone numbers, including numbers for family, friends, the hospital, and police and fire departments. General instructions Ask your health care provider for a referral to a local prenatal education class. Begin classes no later than the beginning of month 6 of your pregnancy. Ask for help if you have counseling or nutritional needs during pregnancy. Your health care provider can offer advice or refer you to specialists for help with various needs. Do not use hot tubs, steam rooms, or saunas. Do not douche or use tampons or scented sanitary pads. Do not cross your legs for long periods of time. Avoid cat litter boxes and soil used by cats. These carry germs that can cause birth defects in the baby and possibly loss of the   fetus by miscarriage or stillbirth. Avoid all smoking, herbs, alcohol, and unprescribed drugs. Chemicals in these products can affect the formation and growth of the baby. Do not use any products that contain nicotine or tobacco, such as cigarettes and e-cigarettes. If you need help quitting, ask your health care provider. Visit your dentist if you have not gone yet during your pregnancy. Use a soft toothbrush to brush your teeth and be gentle when you floss. Contact a health care provider if: You have dizziness. You have mild pelvic cramps, pelvic pressure, or nagging pain in the abdominal area. You have persistent nausea, vomiting, or diarrhea. You have a bad smelling vaginal discharge. You have pain when you urinate. Get help right away if: You have a fever. You are leaking fluid from your vagina. You have spotting or bleeding from your vagina. You have severe abdominal cramping or pain. You have rapid weight gain or weight loss. You have shortness of breath with chest pain. You notice sudden or extreme swelling of your face, hands, ankles, feet, or legs. You  have not felt your baby move in over an hour. You have severe headaches that do not go away when you take medicine. You have vision changes. Summary The second trimester is from week 14 through week 27 (months 4 through 6). It is also a time when the fetus is growing rapidly. Your body goes through many changes during pregnancy. The changes vary from woman to woman. Avoid all smoking, herbs, alcohol, and unprescribed drugs. These chemicals affect the formation and growth your baby. Do not use any tobacco products, such as cigarettes, chewing tobacco, and e-cigarettes. If you need help quitting, ask your health care provider. Contact your health care provider if you have any questions. Keep all prenatal visits as told by your health care provider. This is important. This information is not intended to replace advice given to you by your health care provider. Make sure you discuss any questions you have with your health care provider. Document Released: 10/16/2001 Document Revised: 03/29/2016 Document Reviewed: 12/23/2012 Elsevier Interactive Patient Education  2017 [employer]  

## 2024-08-24 NOTE — Progress Notes (Signed)
 LOW-RISK PREGNANCY VISIT Patient name: Melinda Wright MRN [id number]  Date of birth: 06-30-1996 Chief Complaint:   Routine Prenatal Visit  History of Present Illness:   Melinda Wright is a 28 y.o. G74P1001 female at [redacted]w[redacted]d with an Estimated Date of Delivery: 01/08/25 being seen today for ongoing management of a low-risk pregnancy.   Today she reports no complaints. Contractions: Not present. Vag. Bleeding: None.  Movement: Present. denies leaking of fluid.     06/30/2024    3:21 PM 06/30/2021    8:49 AM 04/04/2021    3:31 PM 02/16/2021   11:10 AM  Depression screen PHQ 2/9  Decreased Interest 0 0 0 0  Down, Depressed, Hopeless 0 0 0 0  PHQ - 2 Score 0 0 0 0  Altered sleeping 0 1 1 2   Tired, decreased energy 0 1 0 2  Change in appetite 0 0 0 0  Feeling bad or failure about yourself  0 0 0 0  Trouble concentrating 0 0 0 0  Moving slowly or fidgety/restless 0 0 0 0  Suicidal thoughts 0 0 0 0  PHQ-9 Score 0 2 1 4         06/30/2024    3:21 PM 06/30/2021    8:50 AM 04/04/2021    3:31 PM 02/16/2021   11:12 AM  GAD 7 : Generalized Anxiety Score  Nervous, Anxious, on Edge 0 0 0 0  Control/stop worrying 0 0 0 0  Worry too much - different things 0 0 0 0  Trouble relaxing 0 0 0 0  Restless 0 0 0 0  Easily annoyed or irritable 0 0 0 0  Afraid - awful might happen 0 0 0 0  Total GAD 7 Score 0 0 0 0      Review of Systems:   Pertinent items are noted in HPI Denies abnormal vaginal discharge w/ itching/odor/irritation, headaches, visual changes, shortness of breath, chest pain, abdominal pain, severe nausea/vomiting, or problems with urination or bowel movements unless otherwise stated above. Pertinent History Reviewed:  Reviewed past medical,surgical, social, obstetrical and family history.  Reviewed problem list, medications and allergies. Physical Assessment:   Vitals:   08/24/24 1554  BP: 117/72  Pulse: 82  Weight: 174 lb (78.9 kg)  Body mass index is 28.96 kg/m.         Physical Examination:   General appearance: Well appearing, and in no distress  Mental status: Alert, oriented to person, place, and time  Skin: Warm & dry  Cardiovascular: Normal heart rate noted  Respiratory: Normal respiratory effort, no distress  Abdomen: Soft, gravid, nontender  Pelvic: Cervical exam deferred         Extremities:    Fetal Status:     Movement: Present  US  20+3 wks,breech,LVEICF,FHR 143 bpm,CX 4.5 cm,anterior placenta gr 0,normal ovaries,SVP of fluid 4.1 cm,EFW 331 g 27%,anatomy complete   Chaperone: N/A No results found for this or any previous visit (from the past 24 hours).  Assessment & Plan:  1) Low-risk pregnancy G2P1001 at [redacted]w[redacted]d with an Estimated Date of Delivery: 01/08/25   2) Fetal isolated EICF, LR NIPS, discussed and gave printed info   Meds: No orders of the defined types were placed in this encounter.  Labs/procedures today: U/S  Plan:  Continue routine obstetrical care  Next visit: prefers in person    Reviewed: Preterm labor symptoms and general obstetric precautions including but not limited to vaginal bleeding, contractions, leaking of fluid and fetal movement  were reviewed in detail with the patient.  All questions were answered. Does have home bp cuff. Office bp cuff given: not applicable. Check bp weekly, let us  know if consistently >140 and/or >90.  Follow-up: Return in about 4 weeks (around 09/21/2024) for LROB, CNM, in person.  No future appointments.  No orders of the defined types were placed in this encounter.  Suzen JONELLE Fetters CNM, [facility] 08/24/2024 4:14 PM

## 2024-09-22 ENCOUNTER — Ambulatory Visit: Admitting: Obstetrics & Gynecology

## 2024-09-22 VITALS — BP 117/76 | HR 98 | Wt 182.0 lb

## 2024-09-22 DIAGNOSIS — Z3A24 24 weeks gestation of pregnancy: Secondary | ICD-10-CM

## 2024-09-22 DIAGNOSIS — O09299 Supervision of pregnancy with other poor reproductive or obstetric history, unspecified trimester: Secondary | ICD-10-CM | POA: Insufficient documentation

## 2024-09-22 DIAGNOSIS — O09292 Supervision of pregnancy with other poor reproductive or obstetric history, second trimester: Secondary | ICD-10-CM | POA: Diagnosis not present

## 2024-09-22 DIAGNOSIS — Z3482 Encounter for supervision of other normal pregnancy, second trimester: Secondary | ICD-10-CM

## 2024-09-22 MED ORDER — OMEPRAZOLE 20 MG PO CPDR: 20.0000 mg | DELAYED_RELEASE_CAPSULE | Freq: Every day | ORAL | 6 refills | Status: AC

## 2024-09-22 NOTE — Progress Notes (Signed)
   LOW-RISK PREGNANCY VISIT Patient name: Melinda Wright MRN [id number]  Date of birth: [birth year] Chief Complaint:   Routine Prenatal Visit  History of Present Illness:   Melinda Wright is a 28 y.o. G41P1001 female at [redacted]w[redacted]d with an Estimated Date of Delivery: 01/08/25 being seen today for ongoing management of a low-risk pregnancy.     06/30/2024    3:21 PM 06/30/2021    8:49 AM 04/04/2021    3:31 PM 02/16/2021   11:10 AM  Depression screen PHQ 2/9  Decreased Interest 0 0 0 0  Down, Depressed, Hopeless 0 0 0 0  PHQ - 2 Score 0 0 0 0  Altered sleeping 0 1 1 2   Tired, decreased energy 0 1 0 2  Change in appetite 0 0 0 0  Feeling bad or failure about yourself  0 0 0 0  Trouble concentrating 0 0 0 0  Moving slowly or fidgety/restless 0 0 0 0  Suicidal thoughts 0 0 0 0  PHQ-9 Score 0  2  1  4       Data saved with a previous flowsheet row definition    Today she reports GERD. Contractions: Not present. Vag. Bleeding: None.  Movement: Present. denies leaking of fluid. Review of Systems:   Pertinent items are noted in HPI Denies abnormal vaginal discharge w/ itching/odor/irritation, headaches, visual changes, shortness of breath, chest pain, abdominal pain, severe nausea/vomiting, or problems with urination or bowel movements unless otherwise stated above. Pertinent History Reviewed:  Reviewed past medical,surgical, social, obstetrical and family history.  Reviewed problem list, medications and allergies. Physical Assessment:   Vitals:   09/22/24 1619  BP: 117/76  Pulse: 98  Weight: 182 lb (82.6 kg)  Body mass index is 30.29 kg/m.        Physical Examination:   General appearance: Well appearing, and in no distress  Mental status: Alert, oriented to person, place, and time  Skin: Warm & dry  Cardiovascular: Normal heart rate noted  Respiratory: Normal respiratory effort, no distress  Abdomen: Soft, gravid, nontender  Pelvic: Cervical exam deferred         Extremities:     Fetal Status:     Movement: Present    Chaperone: n/a    No results found for this or any previous visit (from the past 24 hours).  Assessment & Plan:  1) Low-risk pregnancy G2P1001 at [redacted]w[redacted]d with an Estimated Date of Delivery: 01/08/25   2) GERD, Rx omeprazole,    Meds:  Meds ordered this encounter  Medications   omeprazole (PRILOSEC) 20 MG capsule    Sig: Take 1 capsule (20 mg total) by mouth daily. 1 tablet a day    Dispense:  30 capsule    Refill:  6   Labs/procedures today:   Plan:  Continue routine obstetrical care  Next visit: prefers in person      Follow-up: No follow-ups on file.  No orders of the defined types were placed in this encounter.   Vonn VEAR Inch, MD 09/22/2024 4:45 PM

## 2024-10-12 ENCOUNTER — Encounter: Payer: Self-pay | Admitting: Women's Health

## 2024-10-20 ENCOUNTER — Encounter: Admitting: Obstetrics & Gynecology

## 2024-10-20 ENCOUNTER — Other Ambulatory Visit

## 2024-10-21 ENCOUNTER — Ambulatory Visit (HOSPITAL_COMMUNITY)
Admission: RE | Admit: 2024-10-21 | Discharge: 2024-10-21 | Attending: Obstetrics & Gynecology | Admitting: Obstetrics & Gynecology

## 2024-10-21 DIAGNOSIS — R1011 Right upper quadrant pain: Secondary | ICD-10-CM | POA: Diagnosis not present

## 2024-10-21 DIAGNOSIS — N1330 Unspecified hydronephrosis: Secondary | ICD-10-CM | POA: Diagnosis not present

## 2024-10-21 DIAGNOSIS — R110 Nausea: Secondary | ICD-10-CM | POA: Insufficient documentation

## 2024-10-23 ENCOUNTER — Ambulatory Visit: Admitting: Obstetrics & Gynecology

## 2024-10-23 ENCOUNTER — Other Ambulatory Visit

## 2024-10-23 VITALS — BP 113/72 | HR 82 | Wt 183.0 lb

## 2024-10-23 DIAGNOSIS — Z3A29 29 weeks gestation of pregnancy: Secondary | ICD-10-CM

## 2024-10-23 DIAGNOSIS — R110 Nausea: Secondary | ICD-10-CM

## 2024-10-23 DIAGNOSIS — R140 Abdominal distension (gaseous): Secondary | ICD-10-CM

## 2024-10-23 DIAGNOSIS — Z131 Encounter for screening for diabetes mellitus: Secondary | ICD-10-CM

## 2024-10-23 DIAGNOSIS — Z3483 Encounter for supervision of other normal pregnancy, third trimester: Secondary | ICD-10-CM

## 2024-10-23 DIAGNOSIS — R197 Diarrhea, unspecified: Secondary | ICD-10-CM

## 2024-10-23 DIAGNOSIS — R12 Heartburn: Secondary | ICD-10-CM

## 2024-10-23 DIAGNOSIS — Z3480 Encounter for supervision of other normal pregnancy, unspecified trimester: Secondary | ICD-10-CM

## 2024-10-23 DIAGNOSIS — O99891 Other specified diseases and conditions complicating pregnancy: Secondary | ICD-10-CM

## 2024-10-23 NOTE — Progress Notes (Signed)
" ° °  LOW-RISK PREGNANCY VISIT Patient name: Melinda Wright MRN [id number]  Date of birth: July 07, 1996 Chief Complaint:   Routine Prenatal Visit  History of Present Illness:   Melinda Wright is a 28 y.o. G53P1001 female at [redacted]w[redacted]d with an Estimated Date of Delivery: 01/08/25 being seen today for ongoing management of a low-risk pregnancy.     06/30/2024    3:21 PM 06/30/2021    8:49 AM 04/04/2021    3:31 PM 02/16/2021   11:10 AM  Depression screen PHQ 2/9  Decreased Interest 0 0 0 0  Down, Depressed, Hopeless 0 0 0 0  PHQ - 2 Score 0 0 0 0  Altered sleeping 0 1 1 2   Tired, decreased energy 0 1 0 2  Change in appetite 0 0 0 0  Feeling bad or failure about yourself  0 0 0 0  Trouble concentrating 0 0 0 0  Moving slowly or fidgety/restless 0 0 0 0  Suicidal thoughts 0 0 0 0  PHQ-9 Score 0  2  1  4       Data saved with a previous flowsheet row definition    Today she reports heartburn, nausea, and diarrhea and bloating. Contractions: Not present. Vag. Bleeding: None.  Movement: Present. denies leaking of fluid. Review of Systems:   Pertinent items are noted in HPI Denies abnormal vaginal discharge w/ itching/odor/irritation, headaches, visual changes, shortness of breath, chest pain, abdominal pain, severe nausea/vomiting, or problems with urination or bowel movements unless otherwise stated above. Pertinent History Reviewed:  Reviewed past medical,surgical, social, obstetrical and family history.  Reviewed problem list, medications and allergies. Physical Assessment:   Vitals:   10/23/24 0911  BP: 113/72  Pulse: 82  Weight: 183 lb (83 kg)  Body mass index is 30.45 kg/m.        Physical Examination:   General appearance: Well appearing, and in no distress  Mental status: Alert, oriented to person, place, and time  Skin: Warm & dry  Cardiovascular: Normdiarrhea bloatingal heart rate noted  Respiratory: Normal respiratory effort, no distress  Abdomen: Soft, gravid,  nontender  Pelvic: Cervical exam deferred         Extremities:    Fetal Status:     Movement: Present    Chaperone: n/a    No results found for this or any previous visit (from the past 24 hours).  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  Labs/procedures today:   Plan:  Continue routine obstetrical care  Next visit: prefers in person      Follow-up: Return in about 2 weeks (around 11/06/2024) for LROB, with Dr Dalene of Jan 5th is ok, late in afternoon ok).   No orders of the defined types were placed in this encounter.   Vonn VEAR Inch, MD 10/23/2024 9:51 AM   "

## 2024-10-30 ENCOUNTER — Other Ambulatory Visit

## 2024-11-09 ENCOUNTER — Ambulatory Visit (INDEPENDENT_AMBULATORY_CARE_PROVIDER_SITE_OTHER): Admitting: Obstetrics & Gynecology

## 2024-11-09 ENCOUNTER — Encounter: Payer: Self-pay | Admitting: Obstetrics & Gynecology

## 2024-11-09 VITALS — BP 113/73 | HR 103 | Wt 185.0 lb

## 2024-11-09 DIAGNOSIS — Z3483 Encounter for supervision of other normal pregnancy, third trimester: Secondary | ICD-10-CM

## 2024-11-09 DIAGNOSIS — R110 Nausea: Secondary | ICD-10-CM

## 2024-11-09 DIAGNOSIS — Z3A31 31 weeks gestation of pregnancy: Secondary | ICD-10-CM

## 2024-11-09 NOTE — Progress Notes (Signed)
" ° °  LOW-RISK PREGNANCY VISIT Patient name: Melinda Wright MRN [id number]  Date of birth: 1996/02/19 Chief Complaint:   Routine Prenatal Visit  History of Present Illness:   Melinda Wright is a 29 y.o. G30P1001 female at [redacted]w[redacted]d with an Estimated Date of Delivery: 01/08/25 being seen today for ongoing management of a low-risk pregnancy.     06/30/2024    3:21 PM 06/30/2021    8:49 AM 04/04/2021    3:31 PM 02/16/2021   11:10 AM  Depression screen PHQ 2/9  Decreased Interest 0 0 0 0  Down, Depressed, Hopeless 0 0 0 0  PHQ - 2 Score 0 0 0 0  Altered sleeping 0 1 1 2   Tired, decreased energy 0 1 0 2  Change in appetite 0 0 0 0  Feeling bad or failure about yourself  0 0 0 0  Trouble concentrating 0 0 0 0  Moving slowly or fidgety/restless 0 0 0 0  Suicidal thoughts 0 0 0 0  PHQ-9 Score 0  2  1  4       Data saved with a previous flowsheet row definition    Today she reports improved GI function on pancreatic enzymes. Contractions: Not present. Vag. Bleeding: None.  Movement: Present. denies leaking of fluid. Review of Systems:   Pertinent items are noted in HPI Denies abnormal vaginal discharge w/ itching/odor/irritation, headaches, visual changes, shortness of breath, chest pain, abdominal pain, severe nausea/vomiting, or problems with urination or bowel movements unless otherwise stated above. Pertinent History Reviewed:  Reviewed past medical,surgical, social, obstetrical and family history.  Reviewed problem list, medications and allergies. Physical Assessment:   Vitals:   11/09/24 1632  BP: 113/73  Pulse: (!) 103  Weight: 185 lb (83.9 kg)  Body mass index is 30.79 kg/m.        Physical Examination:   General appearance: Well appearing, and in no distress  Mental status: Alert, oriented to person, place, and time  Skin: Warm & dry  Cardiovascular: Normal heart rate noted  Respiratory: Normal respiratory effort, no distress  Abdomen: Soft, gravid, nontender  Pelvic:  Cervical exam deferred         Extremities:    Fetal Status:     Movement: Present    Chaperone:     No results found for this or any previous visit (from the past 24 hours).  Assessment & Plan:  1) Low-risk pregnancy G2P1001 at [redacted]w[redacted]d with an Estimated Date of Delivery: 01/08/25      Meds: No orders of the defined types were placed in this encounter.  Labs/procedures today: PN2 later this week  Plan:  Continue routine obstetrical care  Next visit: prefers in person      Follow-up: Return in about 2 weeks (around 11/23/2024) for LROB.  No orders of the defined types were placed in this encounter.   Vonn VEAR Inch, MD 11/09/2024 4:51 PM  "

## 2024-11-23 ENCOUNTER — Encounter: Admitting: Advanced Practice Midwife

## 2024-11-27 ENCOUNTER — Ambulatory Visit: Admitting: Obstetrics and Gynecology

## 2024-11-27 ENCOUNTER — Encounter: Payer: Self-pay | Admitting: Obstetrics and Gynecology

## 2024-11-27 VITALS — BP 121/76 | HR 83 | Wt 187.4 lb

## 2024-11-27 DIAGNOSIS — Z9229 Personal history of other drug therapy: Secondary | ICD-10-CM

## 2024-11-27 DIAGNOSIS — Z8759 Personal history of other complications of pregnancy, childbirth and the puerperium: Secondary | ICD-10-CM

## 2024-11-27 DIAGNOSIS — Z3483 Encounter for supervision of other normal pregnancy, third trimester: Secondary | ICD-10-CM | POA: Diagnosis not present

## 2024-11-27 DIAGNOSIS — Z2911 Encounter for prophylactic immunotherapy for respiratory syncytial virus (RSV): Secondary | ICD-10-CM | POA: Diagnosis not present

## 2024-11-27 DIAGNOSIS — O09299 Supervision of pregnancy with other poor reproductive or obstetric history, unspecified trimester: Secondary | ICD-10-CM

## 2024-11-27 DIAGNOSIS — Z3A34 34 weeks gestation of pregnancy: Secondary | ICD-10-CM

## 2024-11-27 NOTE — Progress Notes (Signed)
" ° °  PRENATAL VISIT NOTE  Subjective:  Melinda Wright is a 29 y.o. G2P1001 at [redacted]w[redacted]d being seen today for ongoing prenatal care.  She is currently monitored for the following issues for this low-risk pregnancy and has H/O Preeclampsia; Supervision of normal pregnancy; UTI (urinary tract infection) during pregnancy, first trimester; and Hx of preeclampsia, prior pregnancy, currently pregnant on their problem list.  Patient reports had pelvic pressure/lightning the other day.  Contractions: Not present.  .  Movement: Present. Denies leaking of fluid.   The following portions of the patient's history were reviewed and updated as appropriate: allergies, current medications, past family history, past medical history, past social history, past surgical history and problem list.   Objective:   Vitals:   11/27/24 0931  BP: 121/76  Pulse: 83  Weight: 187 lb 6.4 oz (85 kg)    Fetal Status:  Fetal Heart Rate (bpm): 145 Fundal Height: 34 cm Movement: Present    General: Alert, oriented and cooperative. Patient is in no acute distress.  Skin: Skin is warm and dry. No rash noted.   Cardiovascular: Normal heart rate noted  Respiratory: Normal respiratory effort, no problems with respiration noted  Abdomen: Soft, gravid, appropriate for gestational age.  Pain/Pressure: Absent     Pelvic: Cervical exam deferred        Extremities: Normal range of motion.  Edema: None  Mental Status: Normal mood and affect. Normal behavior. Normal judgment and thought content.   Assessment and Plan:  Pregnancy: G2P1001 at [redacted]w[redacted]d 1. Encounter for supervision of other normal pregnancy in third trimester (Primary) BP and FHR normal Doing well, feeling regular movement    2. [redacted] weeks gestation of pregnancy -Anticipatory guidance regarding swabs next visit  -desires RSV today -GTT and labs today   3. Hx of preeclampsia, prior pregnancy, currently pregnant Normotensive    Preterm labor symptoms and general  obstetric precautions including but not limited to vaginal bleeding, contractions, leaking of fluid and fetal movement were reviewed in detail with the patient. Please refer to After Visit Summary for other counseling recommendations.   Return in about 2 weeks (around 12/11/2024) for OB VISIT (MD or APP).  No future appointments.   Nidia Daring, FNP "

## 2024-11-28 LAB — CBC
Hematocrit: 33.8 % — ABNORMAL LOW (ref 34.0–46.6)
Hemoglobin: 10.5 g/dL — ABNORMAL LOW (ref 11.1–15.9)
MCH: 25.9 pg — ABNORMAL LOW (ref 26.6–33.0)
MCHC: 31.1 g/dL — ABNORMAL LOW (ref 31.5–35.7)
MCV: 83 fL (ref 79–97)
Platelets: 285 10*3/uL (ref 150–450)
RBC: 4.06 x10E6/uL (ref 3.77–5.28)
RDW: 13.2 % (ref 11.7–15.4)
WBC: 8.2 10*3/uL (ref 3.4–10.8)

## 2024-11-28 LAB — ANTIBODY SCREEN: Antibody Screen: NEGATIVE

## 2024-11-28 LAB — GLUCOSE TOLERANCE, 2 HOURS W/ 1HR
Glucose, 1 hour: 147 mg/dL (ref 70–179)
Glucose, 2 hour: 112 mg/dL (ref 70–152)
Glucose, Fasting: 76 mg/dL (ref 70–91)

## 2024-11-28 LAB — HIV ANTIBODY (ROUTINE TESTING W REFLEX): HIV Screen 4th Generation wRfx: NONREACTIVE

## 2024-11-28 LAB — SYPHILIS: RPR W/REFLEX TO RPR TITER AND TREPONEMAL ANTIBODIES, TRADITIONAL SCREENING AND DIAGNOSIS ALGORITHM: RPR Ser Ql: NONREACTIVE

## 2024-12-10 ENCOUNTER — Encounter: Payer: Self-pay | Admitting: Advanced Practice Midwife

## 2024-12-10 ENCOUNTER — Ambulatory Visit: Admitting: Advanced Practice Midwife

## 2024-12-10 VITALS — BP 122/78 | HR 99 | Wt 190.0 lb

## 2024-12-10 DIAGNOSIS — Z3A35 35 weeks gestation of pregnancy: Secondary | ICD-10-CM

## 2024-12-10 DIAGNOSIS — Z3483 Encounter for supervision of other normal pregnancy, third trimester: Secondary | ICD-10-CM

## 2024-12-10 MED ORDER — FERROUS SULFATE 325 (65 FE) MG PO TABS: 325.0000 mg | ORAL_TABLET | ORAL | 2 refills | Status: AC

## 2024-12-10 NOTE — Progress Notes (Signed)
" ° °  LOW-RISK PREGNANCY VISIT Patient name: Melinda Wright MRN [id number]  Date of birth: [birth year] Chief Complaint:   Routine Prenatal Visit  History of Present Illness:   Melinda Wright is a 29 y.o. G22P1001 female at [redacted]w[redacted]d with an Estimated Date of Delivery: 01/08/25 being seen today for ongoing management of a low-risk pregnancy.  Today she reports some restless leg, swelling. Contractions: Not present.  .  Movement: Present. denies leaking of fluid. Review of Systems:   Pertinent items are noted in HPI Denies abnormal vaginal discharge w/ itching/odor/irritation, headaches, visual changes, shortness of breath, chest pain, abdominal pain, severe nausea/vomiting, or problems with urination or bowel movements unless otherwise stated above. Pertinent History Reviewed:  Reviewed past medical,surgical, social, obstetrical and family history.  Reviewed problem list, medications and allergies.    Physical Assessment:     Vitals:   12/10/24 1557  BP: 122/78  Pulse: 99  Weight: 190 lb (86.2 kg)  Body mass index is 31.62 kg/m.        Physical Examination:   General appearance: Well appearing, and in no distress  Mental status: Alert, oriented to person, place, and time  Skin: Warm & dry  Cardiovascular: Normal heart rate noted  Respiratory: Normal respiratory effort, no distress  Abdomen: Soft, gravid, nontender  Pelvic: Cervical exam deferred         Extremities: Edema: Trace Chaperone:  N/A   Fetal Status: Fetal Heart Rate (bpm): 125 Fundal Height: 35 cm Movement: Present      No results found for this or any previous visit (from the past 24 hours).  Assessment & Plan:    Pregnancy: G2P1001 at [redacted]w[redacted]d 1. Encounter for supervision of other normal pregnancy in third trimester (Primary)   2. [redacted] weeks gestation of pregnancy      Meds:  Meds ordered this encounter  Medications   ferrous sulfate  325 (65 FE) MG tablet    Sig: Take 1 tablet (325 mg total) by mouth every  other day.    Dispense:  15 tablet    Refill:  2    Supervising Provider:   JAYNE VONN VEAR [[id number]]   Labs/procedures today: none  Plan:  Continue routine obstetrical care  Next visit: prefers in person    Reviewed: Preterm labor symptoms and general obstetric precautions including but not limited to vaginal bleeding, contractions, leaking of fluid and fetal movement were reviewed in detail with the patient.  All questions were answered. Has home bp cuff.. Check bp weekly, let us  know if >140/90.   Follow-up: No follow-ups on file.  Future Appointments  Date Time Provider Department Center  12/17/2024  3:50 PM Kizzie Suzen SAUNDERS, CNM [facility]  12/23/2024  3:50 PM Loreli Suzen BIRCH, CNM [facility]  12/30/2024  3:50 PM Kizzie Suzen SAUNDERS, CNM [facility]  01/07/2025  1:50 PM Kizzie Suzen SAUNDERS, CNM [facility]    No orders of the defined types were placed in this encounter.  Cathlean Ely DNP, CNM 12/10/2024 4:48 PM  "

## 2024-12-17 ENCOUNTER — Encounter: Admitting: Women's Health

## 2024-12-23 ENCOUNTER — Encounter: Admitting: Advanced Practice Midwife

## 2024-12-30 ENCOUNTER — Encounter: Admitting: Women's Health

## 2025-01-07 ENCOUNTER — Encounter: Admitting: Women's Health
